# Patient Record
Sex: Male | Born: 1955 | Race: Black or African American | Hispanic: No | Marital: Single | Smoking: Never smoker
Health system: Southern US, Community
[De-identification: ages and names within clinical notes are randomized; demographics above are authoritative.]

## PROBLEM LIST (undated history)

## (undated) DIAGNOSIS — I10 Essential (primary) hypertension: Secondary | ICD-10-CM

## (undated) DIAGNOSIS — K4090 Unilateral inguinal hernia, without obstruction or gangrene, not specified as recurrent: Secondary | ICD-10-CM

## (undated) DIAGNOSIS — I2510 Atherosclerotic heart disease of native coronary artery without angina pectoris: Secondary | ICD-10-CM

## (undated) DIAGNOSIS — E7800 Pure hypercholesterolemia, unspecified: Secondary | ICD-10-CM

## (undated) DIAGNOSIS — E876 Hypokalemia: Secondary | ICD-10-CM

## (undated) HISTORY — PX: CORONARY ARTERY BYPASS GRAFT: SHX141

## (undated) HISTORY — DX: Atherosclerotic heart disease of native coronary artery without angina pectoris: I25.10

## (undated) HISTORY — DX: Unilateral inguinal hernia, without obstruction or gangrene, not specified as recurrent: K40.90

## (undated) HISTORY — DX: Essential (primary) hypertension: I10

## (undated) HISTORY — PX: TRACHEOSTOMY: SUR1362

---

## 2006-05-19 ENCOUNTER — Emergency Department: Payer: Self-pay | Admitting: Emergency Medicine

## 2006-05-19 ENCOUNTER — Other Ambulatory Visit: Payer: Self-pay

## 2006-05-23 ENCOUNTER — Other Ambulatory Visit: Payer: Self-pay

## 2006-05-23 ENCOUNTER — Emergency Department: Payer: Self-pay | Admitting: Emergency Medicine

## 2012-03-29 ENCOUNTER — Emergency Department: Payer: Self-pay | Admitting: Emergency Medicine

## 2012-03-29 LAB — URINALYSIS, COMPLETE
Bilirubin,UR: NEGATIVE
Blood: NEGATIVE
Leukocyte Esterase: NEGATIVE
Nitrite: NEGATIVE
Protein: NEGATIVE
Specific Gravity: 1.01 (ref 1.003–1.030)

## 2012-03-29 LAB — COMPREHENSIVE METABOLIC PANEL
Albumin: 3.7 g/dL (ref 3.4–5.0)
Alkaline Phosphatase: 106 U/L (ref 50–136)
BUN: 9 mg/dL (ref 7–18)
Co2: 30 mmol/L (ref 21–32)
Creatinine: 0.94 mg/dL (ref 0.60–1.30)
EGFR (African American): >60
Osmolality: 277 (ref 275–301)
SGOT(AST): 55 U/L — ABNORMAL HIGH (ref 15–37)
SGPT (ALT): 87 U/L — ABNORMAL HIGH (ref 12–78)
Sodium: 139 mmol/L (ref 136–145)
Total Protein: 7.7 g/dL (ref 6.4–8.2)

## 2012-03-29 LAB — CBC
MCH: 33.9 pg (ref 26.0–34.0)
MCHC: 35.3 g/dL (ref 32.0–36.0)
MCV: 96 fL (ref 80–100)
Platelet: 210 10*3/uL (ref 150–440)
RBC: 4.82 10*6/uL (ref 4.40–5.90)
RDW: 14 % (ref 11.5–14.5)

## 2012-03-29 LAB — CK TOTAL AND CKMB (NOT AT ARMC): CK, Total: 173 U/L (ref 35–232)

## 2012-03-29 LAB — TROPONIN I: Troponin-I: <0.02 ng/mL

## 2012-03-30 ENCOUNTER — Emergency Department: Payer: Self-pay | Admitting: Emergency Medicine

## 2012-04-27 ENCOUNTER — Emergency Department: Payer: Self-pay | Admitting: Emergency Medicine

## 2013-06-30 ENCOUNTER — Encounter: Payer: Self-pay | Admitting: Internal Medicine

## 2013-07-29 ENCOUNTER — Encounter: Payer: Self-pay | Admitting: Internal Medicine

## 2013-08-21 ENCOUNTER — Emergency Department: Payer: Self-pay | Admitting: Emergency Medicine

## 2013-08-28 ENCOUNTER — Encounter: Payer: Self-pay | Admitting: Internal Medicine

## 2014-06-11 ENCOUNTER — Emergency Department: Payer: Self-pay | Admitting: Emergency Medicine

## 2014-07-07 ENCOUNTER — Ambulatory Visit: Payer: Self-pay

## 2014-07-13 ENCOUNTER — Observation Stay: Payer: Self-pay | Admitting: Surgery

## 2014-07-14 DIAGNOSIS — K4090 Unilateral inguinal hernia, without obstruction or gangrene, not specified as recurrent: Secondary | ICD-10-CM | POA: Insufficient documentation

## 2014-07-31 DIAGNOSIS — E119 Type 2 diabetes mellitus without complications: Secondary | ICD-10-CM | POA: Insufficient documentation

## 2014-07-31 DIAGNOSIS — I2510 Atherosclerotic heart disease of native coronary artery without angina pectoris: Secondary | ICD-10-CM | POA: Insufficient documentation

## 2014-07-31 DIAGNOSIS — I10 Essential (primary) hypertension: Secondary | ICD-10-CM | POA: Insufficient documentation

## 2014-08-23 LAB — SURGICAL PATHOLOGY

## 2014-08-29 NOTE — Op Note (Signed)
PATIENT NAME:  Scott Yoder, Scott Yoder MR#:  [id number] DATE OF BIRTH:  [birth year]  DATE OF PROCEDURE:  07/13/2014  PREOPERATIVE DIAGNOSIS: Incarcerated right inguinal hernia with small bowel obstruction.   POSTOPERATIVE DIAGNOSES:  1. Incarcerated right indirect inguinal hernia with bowel obstruction.  2. Noncommunicating right hydrocele.   PROCEDURE PERFORMED: Repair of right inguinal hernia with Bassini technique, high ligation of sac, repair of noncommunicating hydrocele, and orchiopexy.   SURGEON: Raynald KempMark A Dericka Ostenson, MD   INTRAOPERATIVE CONSULTATION:  Dr. Evelene CroonWolff of urology.   SPECIMENS: Hernia sac.   ESTIMATED BLOOD LOSS: 50 mL.   DRAINS: None.   COUNT:   LAP and needle count correct x 2.   DESCRIPTION OF PROCEDURE: With informed consent, supine position and general oral endotracheal anesthesia, the patient's abdomen was widely clipped of hair, including the right groin. The abdomen then was prepped with ChloraPrep, the scrotum and penis with Betadine. Collier Flowersoban was applied. Timeout was observed.   A transverse oriented skin incision was fashioned above the right inguinal ligament with a scalpel and carried through subcutaneous tissues and Scarpa's fascia with electrocautery and small bridging veins were divided in continuity between ties of #2-0 Vicryl suture. The external oblique aponeurosis was identified. It was incised laterally and carried with sharp dissection through the external ring. The ilioinguinal nerve was splayed out over the surface and was later divided between clamps and ties of #2-0 Vicryl, and submitted.   The hernia sac was encircled with blunt technique at the pubic tubercle. Gentle  pressure from the scrotum from below demonstrated this able to be easily reduced. Dissection of the cord structures demonstrated a testicle which was not attached to the gubernaculum. 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 Due to the nature of the intraoperative findings, I asked Dr. Evelene CroonWolff  for his assistance. He confirmed that this was indeed the case. A  noncommunicating hydrocele was identified. This was opened. It was then marsupialized  posteriorly on the testicle with a running locking #3-0 chromic suture. Hemostasis was adequate. Dissection of the cord demonstrated a thick, chronic appearing indirect inguinal hernia sac which was tediously dissected off the vas and vessels. It was ligated at its base utilizing a locking #0 silk suture x 2.  It was returned into the peritoneal cavity. Orchiopexy was then performed with an 0 silk suture through the gubernaculum to the posterior aspect of the testicle. Testicle was then returned to the scrotum. [facility] then [facility].  A relaxing incision was then performed and several interrupted #0 Ethibond sutures were placed starting at the pubic tubercle and ending just medial to the internal ring  reinforcing the floor and closing the ring. There was  minimal tension. The wound was then copiously irrigated with antibiotic solution.  External oblique aponeurosis was then reapproximated with #0 Vicryl suture in running fashion. Scarpa's fascia reapproximated with 3-0 Vicryl suture. A 3-0 Vicryl suture was placed in the deep dermal layer, followed by 4-0 running subcuticular in the skin.  Benzoin, Steri-Strips, and occlusive sterile dressing was then placed and the patient was then taken to the recovery room in stable and satisfactory condition being extubated by anesthesia.     ____________________________ Redge GainerMark A. Egbert GaribaldiBird, MD mab:tr D: 07/14/2014 16:26:02 ET T: 07/14/2014 18:01:33 ET JOB#: [id number]  cc: Loraine LericheMark A. Egbert GaribaldiBird, MD, <Dictator> Raynald KempMARK A Jaeanna Mccomber MD ELECTRONICALLY SIGNED 07/21/2014 13:41

## 2014-08-29 NOTE — Consult Note (Signed)
PATIENT NAME:  Scott Yoder, Scott Yoder MR#:  [id number] DATE OF BIRTH:  [birth year]  DATE OF CONSULTATION:  07/13/2014  REQUESTING PHYSICIAN:  Natale LayMark Bird, MD  CONSULTING PHYSICIAN:  Suszanne ConnersMichael R. Evelene CroonWolff, MD   REASON FOR CONSULTATION: Intraoperative consultation for hydroceles.   HISTORY OF PRESENT ILLNESS: [name] was undergoing right inguinal herniorrhaphy by Dr. Egbert GaribaldiBird and he noticed swelling of the scrotum. Urologic consultation was requested.   Intraoperative exam revealed a non-communicating right hydrocele. The hydrocele sac was opened and did not appear to communicate with the hernia sac. The hydrocele sac was then marsupialized posteriorly. At this point, urological consultation was suspended.  Remaining portion of the operation will be dictated by Dr. Egbert GaribaldiBird.      ____________________________ Suszanne ConnersMichael R. Evelene CroonWolff, MD mrw:DT D: 07/13/2014 15:22:19 ET T: 07/13/2014 16:51:32 ET JOB#: [id number]  cc: Suszanne ConnersMichael R. Evelene CroonWolff, MD, <Dictator> Orson ApeMICHAEL R WOLFF MD ELECTRONICALLY SIGNED 07/14/2014 13:40

## 2014-08-29 NOTE — H&P (Signed)
Subjective/Chief Complaint right groin pain and abdominal pain   History of Present Illness 59 y/o male with long standing right inguinal hernia,  Recently he began having more significant symptoms of pain and swelling of said hernia.  Two days ago began having much more swelling on right scrotum.  Last flatus 2 days ago, no nausea or vomiting, last Bowel movement yesterday.  Came to ER today with increasing pain,  Workup in ER shows large right inguinal hernia containing small loops of bowel with clear obstruction.  He has been seen in urology office as was evaluated with CT scan 2 weeks ago showing large right inguinal hernia.  No prior repairs.   Past History CAD CABg Hypertension   Past Medical Health Coronary Artery Disease, Hypertension   Code Status Full Code   Past Med/Surgical Hx:  Tobacco Use:   Diabetes:   Hypertension:   CABG:   ALLERGIES:  No Known Allergies:    Other Allergies none.   HOME MEDICATIONS: Medication Instructions Status  cyclobenzaprine 10 mg oral tablet 1  orally 3 times a day, As Needed for muscle spasms Active  Tylenol 500 mg oral tablet 1 tab(s) orally every 4 hours, As Needed - for Pain Active  omeprazole 20 mg oral delayed release capsule 1 cap(s) orally once a day for stomach pain Active  simvastatin 20 mg oral tablet 1 tab(s) orally once a day (at bedtime) Active  fluticasone nasal 50 mcg/inh nasal spray 2 spray(s) nasal once a day Active  Percocet 5/325 oral tablet 1 cap(s) orally every 4 to 6 hours, As Needed - for Pain Active  aspirin 81 mg oral tablet, chewable 1 tab(s) orally once a day Active  carvedilol 25 mg oral tablet 2 tab(s) orally 2 times a day Active  DULoxetine 60 mg oral delayed release capsule 1 cap(s) orally once a day (in the evening) Active  hydrOXYzine hydrochloride 25 mg oral tablet 1 tab(s) orally 3 times a day, As Needed - for Itching Active  lisinopril 20 mg oral tablet 1 tab(s) orally 2 times a day Active   hydrochlorothiazide 25 mg oral tablet 1 tab(s) orally once a day (in the morning) Active  Nitrostat 0.4 mg sublingual tablet 1 tab(s) sublingual every 5 minutes, As Needed - for Chest Pain (may take up to 3 doses) Active   Family and Social History:  Family History Hypertension   Social History positive  tobacco, negative ETOH, negative Illicit drugs   + Tobacco Current (within 1 year)   Place of Living Home   Review of Systems:  Subjective/Chief Complaint see above.  remaining 10 point review obtained and otherwise negative.   Abdominal Pain Yes   Diarrhea No   Constipation Yes   Nausea/Vomiting No   SOB/DOE No   Chest Pain No   Dysuria No   Medications/Allergies Reviewed Medications/Allergies reviewed   Physical Exam:  GEN no acute distress, thin, disheveled, temp 97.6 p 86 bp 125/89   HEENT pale conjunctivae, PERRL   NECK supple  No masses   RESP normal resp effort  clear BS   CARD regular rate   ABD denies tenderness  no liver/spleen enlargement  positive hernia  soft  large non-reducible incarcerated inguinal hernia   LYMPH negative neck   EXTR negative cyanosis/clubbing   SKIN normal to palpation, No rashes, No ulcers   NEURO cranial nerves intact   PSYCH A+O to time, place, person, good insight   Lab Results:  Hepatic:  15-Mar-16 04:54  Bilirubin, Total 0.9 (0.3-1.2 NOTE: New Reference Range  06/22/14)  Alkaline Phosphatase 75 (38-126 NOTE: New Reference Range  06/22/14)  SGPT (ALT)  91 (17-63 NOTE: New Reference Range  06/22/14)  SGOT (AST)  88 (15-41 NOTE: New Reference Range  06/22/14)  Total Protein, Serum  8.8 (6.5-8.1 NOTE: New Reference Range  06/22/14)  Albumin, Serum 4.8 (3.5-5.0 NOTE: New reference range  06/22/14)  Routine Chem:  15-Mar-16 04:54   Lipase  68 (22-51 NOTE: New Reference Range  06/22/14)  Glucose, Serum  141 (65-99 NOTE: New Reference Range  06/22/14)  BUN  23 (6-20 NOTE: New Reference Range   06/22/14)  Creatinine (comp)  1.56 (0.61-1.24 NOTE: New Reference Range  06/22/14)  Sodium, Serum  134 (135-145 NOTE: New Reference Range  06/22/14)  Potassium, Serum 3.7 (3.5-5.1 NOTE: New Reference Range  06/22/14)  Chloride, Serum  96 (101-111 NOTE: New Reference Range  06/22/14)  CO2, Serum 27 (22-32 NOTE: New Reference Range  06/22/14)  Calcium (Total), Serum 9.9 (8.9-10.3 NOTE: New Reference Range  06/22/14)  eGFR (African American)  57  eGFR (Non-African American)  49 (eGFR values <50m/min/1.73 m2 may be an indication of chronic kidney disease (CKD). Calculated eGFR is useful in patients with stable renal function. The eGFR calculation will not be reliable in acutely ill patients when serum creatinine is changing rapidly. It is not useful in patients on dialysis. The eGFR calculation may not be applicable to patients at the low and high extremes of body sizes, pregnant women, and vegetarians.)  Anion Gap 11    07:28   Lactic Acid  Venous 1.0 (0.5-2.0 NOTE: New Reference Range  06/22/14)  Routine Hem:  15-Mar-16 04:54   WBC (CBC) 6.6  RBC (CBC) 5.30  Hemoglobin (CBC) 17.0  Hematocrit (CBC) 50.3  Platelet Count (CBC) 298  MCV 95  MCH 32.1  MCHC 33.8  RDW 13.2  Neutrophil % 62.8  Lymphocyte % 27.0  Monocyte % 8.2  Eosinophil % 1.3  Basophil % 0.7  Neutrophil # 4.1  Lymphocyte # 1.8  Monocyte # 0.5  Eosinophil # 0.1  Basophil # 0.0 (Result(s) reported on 13 Jul 2014 at 05:55AM.)   Radiology Results: LabUnknown:    15-Mar-16 08:05, CT Abdomen and Pelvis With Contrast  PACS Image  CT:  CT Abdomen and Pelvis With Contrast  REASON FOR EXAM:    (1) abd pain with large hernia eval incarceration;   (2) abd pain with large herni  COMMENTS:       PROCEDURE: CT  - CT ABDOMEN / PELVIS  W  - Jul 13 2014  8:05AM     CLINICAL DATA:  Progressive abdominal pain, most severe in the  periumbilical region. Nausea and vomiting    EXAM:  CT ABDOMEN AND PELVIS  WITH CONTRAST    TECHNIQUE:  Multidetector CT imaging of the abdomen and pelvis was performed  using the standard protocol following bolus administration of  intravenous contrast. Oral contrast also administered.    CONTRAST:  80 mL Omnipaque 300 nonionic    COMPARISON:  July 07, 2014    FINDINGS:  There is patchy atelectasis in the left base region. There are  multiple foci of coronary artery calcification.    There is hepatic steatosis. There is a 1 cm apparent cyst in the  anterior segment right lobe of the liver. No other focal liver  lesions are identified. Gallbladder wall is not thickened. There is  no appreciable biliary duct dilatation.  Spleen,  pancreas, and adrenals appear normal. Kidneys bilaterally  show no mass or hydronephrosis on either side. There is no renal or  ureteral calculus on either side.    In the pelvis, urinary bladder is midline with normal wall  thickness. There is no pelvic mass or pelvic fluid collection. There  are several small calcifications in the prostate. There is no  periappendiceal region inflammation.    There is a right inguinal hernia which contains bowel. There is  bowel obstruction with transition zone occurring within the right  inguinal hernia suggesting a degree of incarceration within this  hernia. There is no free air or portal venous air.    There is no appreciable ascites or abscess. No adenopathy is seen in  the abdomen or pelvis. There is atherosclerotic change in the aorta  and iliac arteries without aneurysm. There are no blastic or lytic  bone lesions. There is vacuum phenomenon at L5-S1. There is a rather  minimal ventral hernia containing only fat.     IMPRESSION:  Findings consistent with incarcerated right inguinal hernia with  bowel obstruction. Transition zone occurs within this hernia in the  right inguinal region. No free air.    No abscess.    Hepatic steatosis.    Atelectatic change left lung base.  Multiple foci of coronary artery  calcification present.    Critical Value/emergent results were called by telephone at the time  of interpretation on 07/13/2014 at 8:17 am to Dr. Archie Balboa, ED  physician , who verbally acknowledged these results.      Electronically Signed    By: Daneen Schick M.D.    On: 07/13/2014 08:17         Verified By: Leafy Kindle. WOODRUFF, M.D.,    Assessment/Admission Diagnosis 59 year old male with large incarcerated right inguinal hernia with small bowel obstruction. Needs urgent repair.   Plan Admit, plan open right inguinal hernia with or without mesh based on operative findings.  I discussed with him need for repair due to bowel obstruction.  Reviewed with him risks of bleeding, infection, possible bowel resection, mesh infection and chronic pain.  All questions addressed  All PACS images personally reviewed.   Electronic Signatures: Sherri Rad (MD)  (Signed 15-Mar-16 12:12)  Authored: CHIEF COMPLAINT and HISTORY, PAST MEDICAL/SURGIAL HISTORY, ALLERGIES, Other Allergies, HOME MEDICATIONS, FAMILY AND SOCIAL HISTORY, REVIEW OF SYSTEMS, PHYSICAL EXAM, LABS, Radiology, ASSESSMENT AND PLAN   Last Updated: 15-Mar-16 12:12 by Sherri Rad (MD)

## 2016-03-15 ENCOUNTER — Emergency Department
Admission: EM | Admit: 2016-03-15 | Discharge: 2016-03-15 | Disposition: A | Discharge status: Home / Self Care | Payer: Self-pay | Attending: Emergency Medicine | Admitting: Emergency Medicine

## 2016-03-15 ENCOUNTER — Encounter: Payer: Self-pay | Admitting: Emergency Medicine

## 2016-03-15 DIAGNOSIS — I2510 Atherosclerotic heart disease of native coronary artery without angina pectoris: Secondary | ICD-10-CM | POA: Insufficient documentation

## 2016-03-15 DIAGNOSIS — I10 Essential (primary) hypertension: Secondary | ICD-10-CM | POA: Insufficient documentation

## 2016-03-15 DIAGNOSIS — Z7982 Long term (current) use of aspirin: Secondary | ICD-10-CM | POA: Insufficient documentation

## 2016-03-15 DIAGNOSIS — M75102 Unspecified rotator cuff tear or rupture of left shoulder, not specified as traumatic: Secondary | ICD-10-CM | POA: Insufficient documentation

## 2016-03-15 DIAGNOSIS — F17200 Nicotine dependence, unspecified, uncomplicated: Secondary | ICD-10-CM | POA: Insufficient documentation

## 2016-03-15 DIAGNOSIS — E119 Type 2 diabetes mellitus without complications: Secondary | ICD-10-CM | POA: Insufficient documentation

## 2016-03-15 DIAGNOSIS — Z951 Presence of aortocoronary bypass graft: Secondary | ICD-10-CM | POA: Insufficient documentation

## 2016-03-15 HISTORY — DX: Pure hypercholesterolemia, unspecified: E78.00

## 2016-03-15 HISTORY — DX: Hypokalemia: E87.6

## 2016-03-15 MED ORDER — PREDNISONE 10 MG PO TABS: 10.0000 mg | ORAL_TABLET | Freq: Every day | ORAL | 0 refills | Status: DC

## 2016-03-15 NOTE — ED Triage Notes (Signed)
Pt states on Monday was working on a car and lifted the hood of the car up. Pt states next day had pain and limited movement to L shoulder. Pt states he is unable to raise his shoulder as high as his R arm and c/o pain with movement. Pt states has not had his BP medication today.

## 2016-03-15 NOTE — ED Notes (Signed)
See triage note  States he lifted a car hood on Monday  Woke up on Tuesday with limited movement to left arm/shoulder

## 2016-03-15 NOTE — Discharge Instructions (Signed)
Please ice left shoulder 20 minutes every hour for the next few days. Wear a sling for no more than 3 days as needed for comfort. Take prednisone as prescribed. Follow-up with orthopedic office 5-7 days if no improvement. Return to the ER for any worsening symptoms urgent changes in her health.

## 2016-03-15 NOTE — ED Provider Notes (Signed)
[facility]-EMERGENCY DEPARTMENT Provider Note   CSN: [id number] Arrival date & time: 03/15/16  1754     History   Chief Complaint Chief Complaint  Patient presents with  . Shoulder Pain    HPI Scott Luther RedoHarrelson Jr. is a 60 y.o. male presents to the emergency department for evaluation of left shoulder pain. Patient states 3 days ago he was lifting a car hood, denies any trauma or injury, the next morning developed severe left shoulder pain he describes as weakness and pain that is 9 out of 10 with attempted abduction and flexion greater than 90. Pain is over the lateral deltoid. He describes the pain as a constant ache. Pain is improved with keeping the arm by his side. He denies any neck pain numbness tingling or radicular symptoms. No chest pain, shortness of breath. He is unable to take NSAIDs, has not taken any Tylenol. Patient has a history of hypertension, is on blood pressure medications which she has at home, has not taken his medications today. Denies any headaches, vision changes, chest pain.  HPI  Past Medical History:  Diagnosis Date  . Coronary artery disease   . Hernia, inguinal   . High cholesterol   . Hypertension   . Hypokalemia     Patient Active Problem List   Diagnosis Date Noted  . Atherosclerosis of coronary artery 07/31/2014  . Diabetes (HCC) 07/31/2014  . BP (high blood pressure) 07/31/2014  . Coronary artery disease   . Hernia, inguinal 07/14/2014    Past Surgical History:  Procedure Laterality Date  . CORONARY ARTERY BYPASS GRAFT    . TRACHEOSTOMY         Home Medications    Prior to Admission medications   Medication Sig Start Date End Date Taking? Authorizing Provider  aspirin 81 MG tablet Take 81 mg by mouth daily.    Historical Provider, MD  carvedilol (COREG) 25 MG tablet Take 2 tablets by mouth 2 (two) times daily.    Historical Provider, MD  cyclobenzaprine (FLEXERIL) 10 MG tablet Take 10 mg by mouth 3 (three) times daily as needed.     Historical Provider, MD  DULoxetine (CYMBALTA) 60 MG capsule Take 60 mg by mouth daily.    Historical Provider, MD  fluticasone (FLONASE) 50 MCG/ACT nasal spray Place 2 sprays into the nose daily.    Historical Provider, MD  hydrochlorothiazide (HYDRODIURIL) 25 MG tablet Take 25 mg by mouth daily.    Historical Provider, MD  HYDROcodone-acetaminophen (NORCO/VICODIN) 5-325 MG per tablet Take 1 tablet by mouth every 4 (four) hours as needed.    Historical Provider, MD  hydrOXYzine (ATARAX/VISTARIL) 25 MG tablet Take 20 mg by mouth 3 (three) times daily as needed.    Historical Provider, MD  lisinopril (PRINIVIL,ZESTRIL) 20 MG tablet Take 20 mg by mouth 2 (two) times daily.    Historical Provider, MD  nitroGLYCERIN (NITROSTAT) 0.4 MG SL tablet Place 0.4 mg under the tongue 3 (three) times daily as needed. 1 tab every five minutes x 3, as needed x chest pain    Historical Provider, MD  Omeprazole 20 MG TBEC Take 20 mg by mouth daily.    Historical Provider, MD  predniSONE (DELTASONE) 10 MG tablet Take 1 tablet (10 mg total) by mouth daily. 6,5,4,3,2,1 six day taper 03/15/16   Evon Slackhomas C Samreet Edenfield, PA-C  simvastatin (ZOCOR) 20 MG tablet Take 20 mg by mouth at bedtime.    Historical Provider, MD    Family History Family History  Problem Relation  Age of Onset  . Diabetes Mother   . Hypertension Mother   . Stroke Mother     Social History Social History  Substance Use Topics  . Smoking status: Current Some Day Smoker  . Smokeless tobacco: Never Used  . Alcohol use No     Allergies   Patient has no known allergies.   Review of Systems Review of Systems  Constitutional: Negative.  Negative for activity change, appetite change, chills and fever.  HENT: Negative for congestion, ear pain, mouth sores, rhinorrhea, sinus pressure, sore throat and trouble swallowing.   Eyes: Negative for photophobia, pain and discharge.  Respiratory: Negative for cough, chest tightness and shortness of breath.     Cardiovascular: Negative for chest pain and leg swelling.  Gastrointestinal: Negative for abdominal distention, abdominal pain, diarrhea, nausea and vomiting.  Genitourinary: Negative for difficulty urinating and dysuria.  Musculoskeletal: Positive for arthralgias. Negative for back pain, gait problem and joint swelling.  Skin: Negative for color change and rash.  Neurological: Negative for dizziness and headaches.  Hematological: Negative for adenopathy.  Psychiatric/Behavioral: Negative for agitation and behavioral problems.     Physical Exam Updated Vital Signs BP (!) 161/103 (BP Location: Right Arm)   Pulse 76   Temp 98 F (36.7 C) (Oral)   Resp 18   Ht 5\' 10"  (1.778 m)   Wt 79.4 kg   SpO2 98%   BMI 25.11 kg/m   Physical Exam  Constitutional: He appears well-developed and well-nourished.  HENT:  Head: Normocephalic and atraumatic.  Right Ear: External ear normal.  Left Ear: External ear normal.  Nose: Nose normal.  Eyes: Conjunctivae and EOM are normal. Pupils are equal, round, and reactive to light.  Neck: Normal range of motion. Neck supple.  Cardiovascular: Normal rate and regular rhythm.   No murmur heard. Pulmonary/Chest: Effort normal and breath sounds normal. No respiratory distress. He has no wheezes. He has no rales. He exhibits no tenderness.  Abdominal: Soft. There is no tenderness. There is no guarding.  Musculoskeletal:  Examination of the cervical spine shows patient has full range of motion with no discomfort. Nontender along the spinous process. Patient has no asymmetry noted along the shoulders. He is tender along the lateral deltoid and subacromial space. Has bicipital groove tenderness left. He has active abduction and flexion to 90 with inability to lift the arm above 90. Passively he can be lifted 130 abduction and is able to maintain range of motion. He has severe pain with abduction and flexion greater than 90. He has good internal and external  rotation with no discomfort. There is no warmth or redness throughout the shoulder. He has full composite fist and grip strength 5 out of 5.  Neurological: He is alert.  Skin: Skin is warm and dry.  Psychiatric: He has a normal mood and affect.  Nursing note and vitals reviewed.    ED Treatments / Results  Labs (all labs ordered are listed, but only abnormal results are displayed) Labs Reviewed - No data to display  EKG  EKG Interpretation None       Radiology No results found.  Procedures Procedures (including critical care time) SPLINT APPLICATION Date/Time: 7:27 PM Authorized by: Patience MuscaGAINES, Marshall Kampf CHRISTOPHER Consent: Verbal consent obtained. Risks and benefits: risks, benefits and alternatives were discussed Consent given by: patient Splint applied by: ED tech Location details: Left sling  Splint type: Sling  Supplies used: Sling  Post-procedure: The splinted body part was neurovascularly unchanged following the procedure.  Patient tolerance: Patient tolerated the procedure well with no immediate complications.     Medications Ordered in ED Medications - No data to display   Initial Impression / Assessment and Plan / ED Course  I have reviewed the triage vital signs and the nursing notes.  Pertinent labs & imaging results that were available during my care of the patient were reviewed by me and considered in my medical decision making (see chart for details).  Clinical Course     60 year old male with left shoulder pain. Pain consistent with rotator cuff syndrome, possible rotator cuff tear. He is placed into a sling for 2-3 days only. He will ice the shoulder 20 minutes every hour for the next few days. Prednisone 6 day taper, unable tolerate NSAIDs. He is advised to restart his home blood pressure medications, states he has his medications at home. Currently denies any headache, vision changes, chest pain. He will follow-up with orthopedics in 5-7 days if no  improvement of left shoulder pain.  Final Clinical Impressions(s) / ED Diagnoses   Final diagnoses:  Rotator cuff syndrome, left    New Prescriptions New Prescriptions   PREDNISONE (DELTASONE) 10 MG TABLET    Take 1 tablet (10 mg total) by mouth daily. 6,5,4,3,2,1 six day taper     Evon Slack, PA-C 03/15/16 1928    Charlynne Pander, MD 03/15/16 [id number]

## 2016-05-08 ENCOUNTER — Other Ambulatory Visit: Payer: Self-pay | Admitting: Orthopedic Surgery

## 2016-05-08 DIAGNOSIS — M25512 Pain in left shoulder: Secondary | ICD-10-CM

## 2016-05-14 ENCOUNTER — Ambulatory Visit
Admission: RE | Admit: 2016-05-14 | Discharge: 2016-05-14 | Disposition: A | Discharge status: Home / Self Care | Payer: Medicare Other | Source: Ambulatory Visit | Attending: Orthopedic Surgery | Admitting: Orthopedic Surgery

## 2016-05-14 DIAGNOSIS — M25512 Pain in left shoulder: Secondary | ICD-10-CM | POA: Insufficient documentation

## 2016-05-14 DIAGNOSIS — M75102 Unspecified rotator cuff tear or rupture of left shoulder, not specified as traumatic: Secondary | ICD-10-CM | POA: Insufficient documentation

## 2016-05-23 IMAGING — CT CT ABD-PELV W/O CM
2 of 4 series · 15 of 46 positions shown, 17 images · non-contrast
Comparison: None.

CLINICAL DATA: Initial encounter for right groin swelling
intermittently with pain for approximately 1 year.

EXAM:
CT ABDOMEN AND PELVIS WITHOUT CONTRAST
TECHNIQUE: Multidetector CT imaging of the abdomen and pelvis was performed
following the standard protocol without IV contrast.

[Series 5: routine without. · axial · non-contrast · 0.72mm/px · z∈[-970,-495]mm · 12 of 105 slices shown, 14 images]
[im 5/105  soft-tissue]
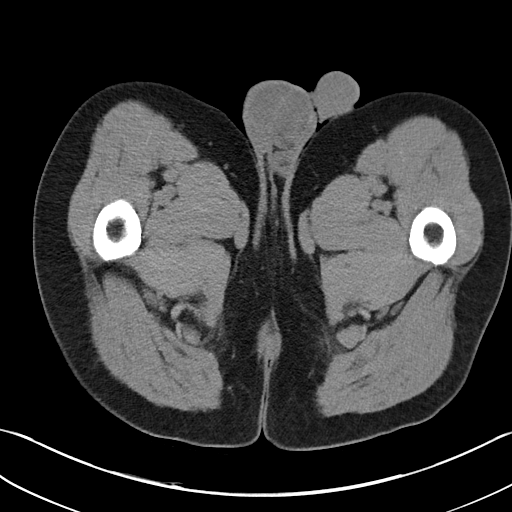
[im 5/105  bone]
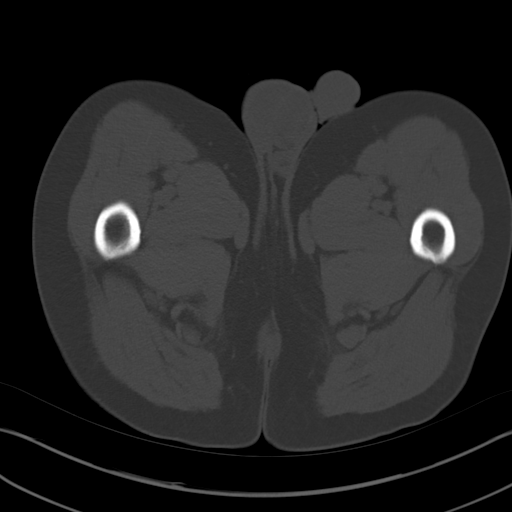
[im 15/105  soft-tissue]
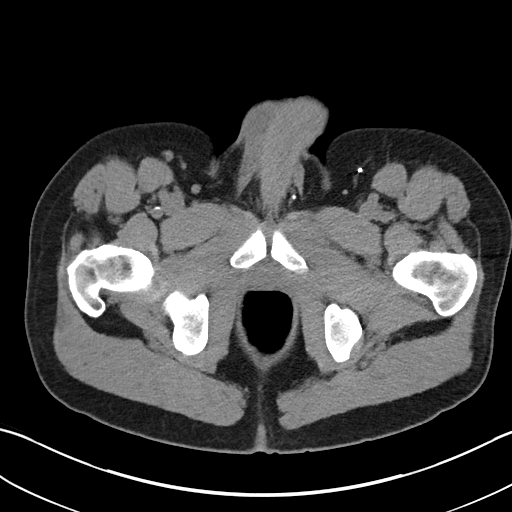
[im 24/105  soft-tissue]
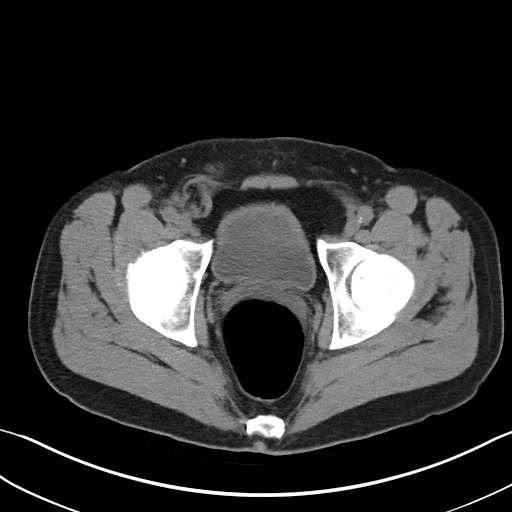
[im 34/105  soft-tissue]
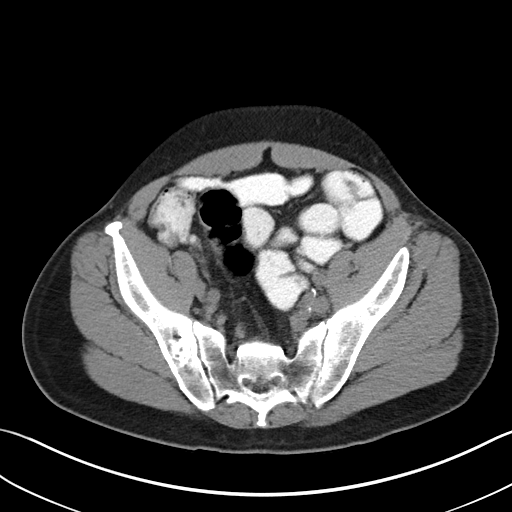
[im 38/105  soft-tissue]
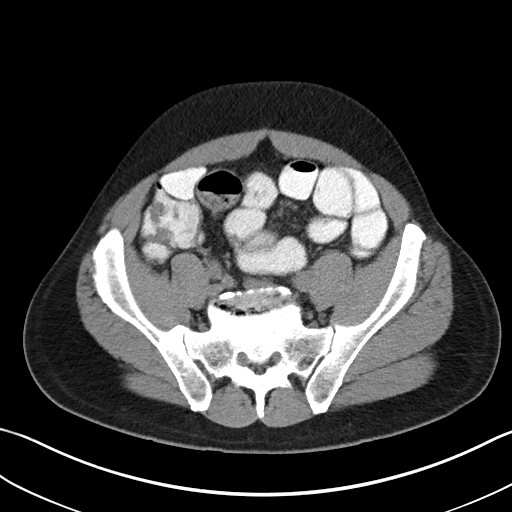
[im 48/105  soft-tissue]
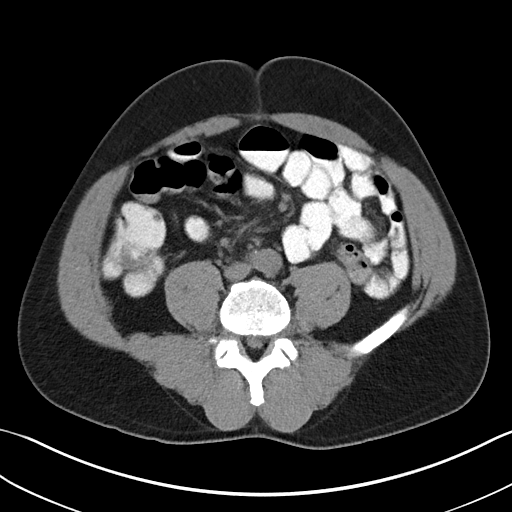
[im 57/105  soft-tissue]
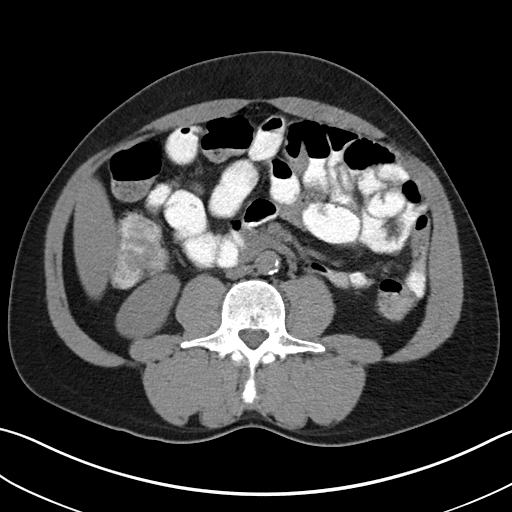
[im 67/105  soft-tissue]
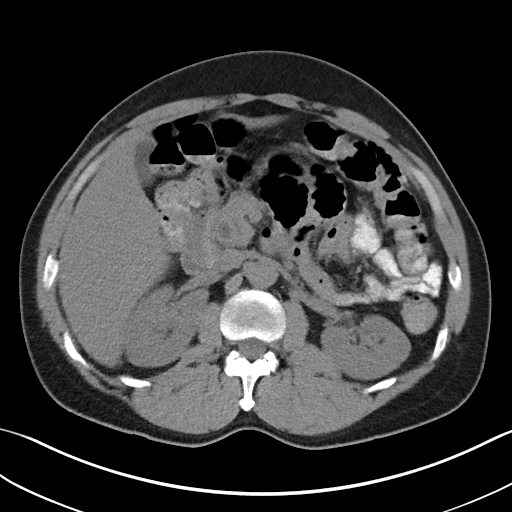
[im 71/105  soft-tissue]
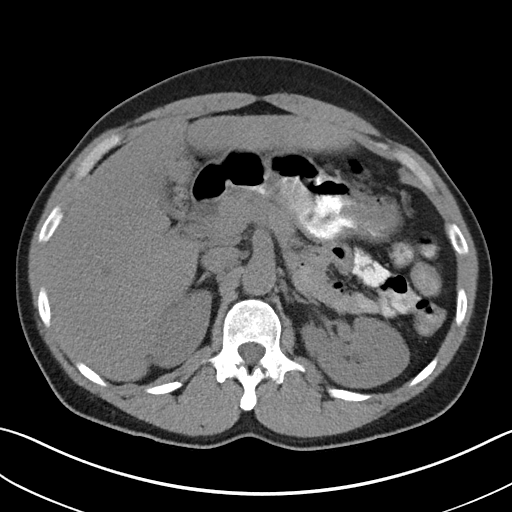
[im 71/105  bone]
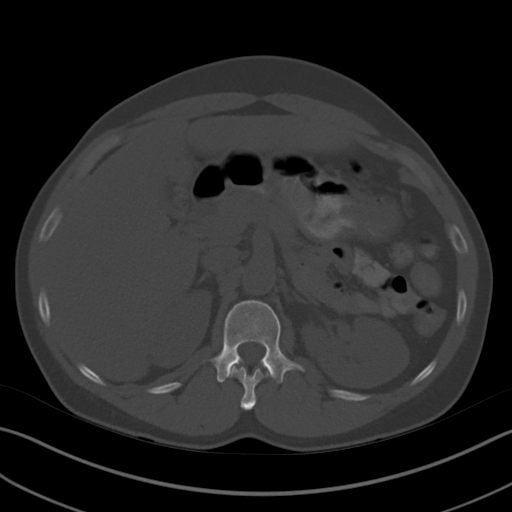
[im 81/105  soft-tissue]
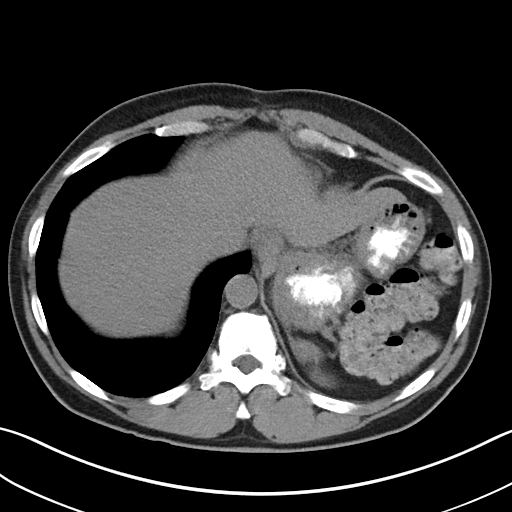
[im 90/105  soft-tissue]
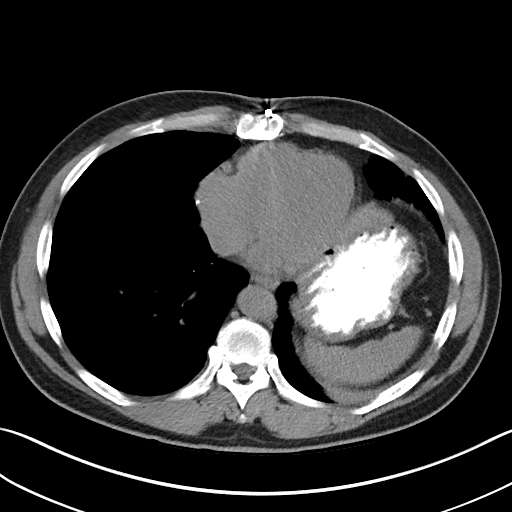
[im 100/105  soft-tissue]
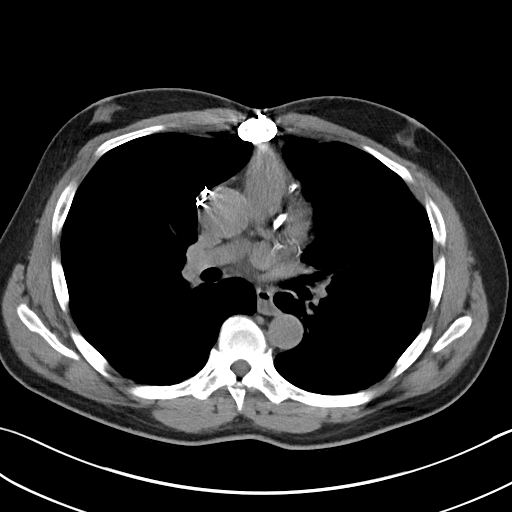

[Series 6: cor routine without · coronal · non-contrast · 0.71mm/px · 3 of 154 slices shown]
[im 52/154  soft-tissue]
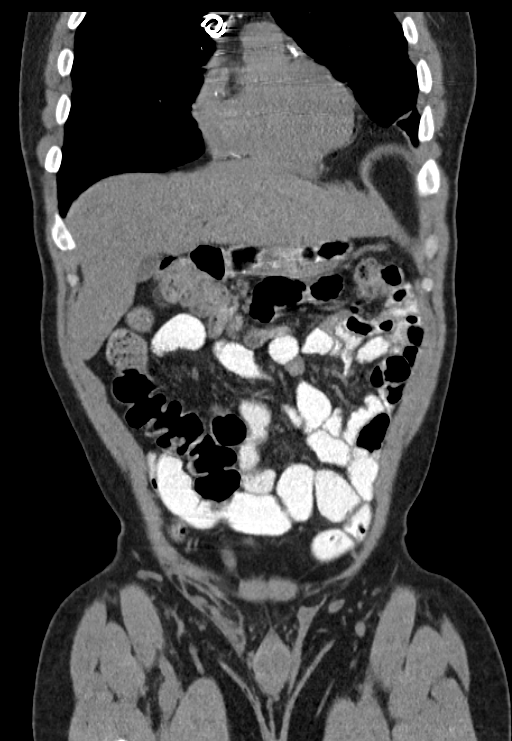
[im 69/154  soft-tissue]
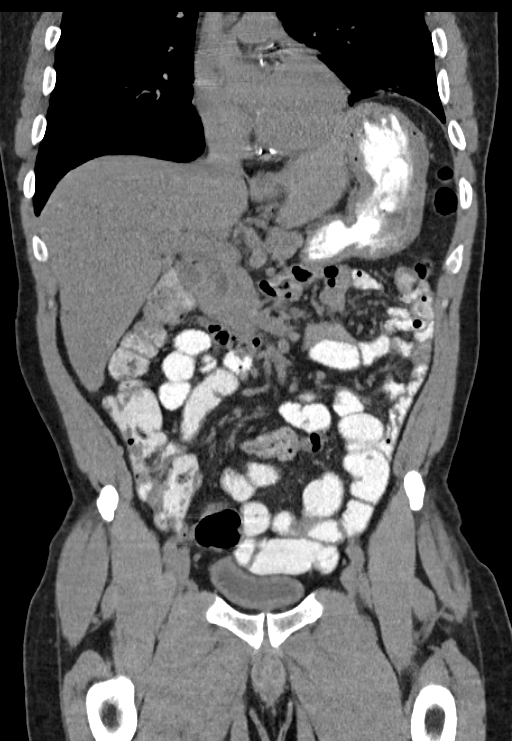
[im 86/154  soft-tissue]
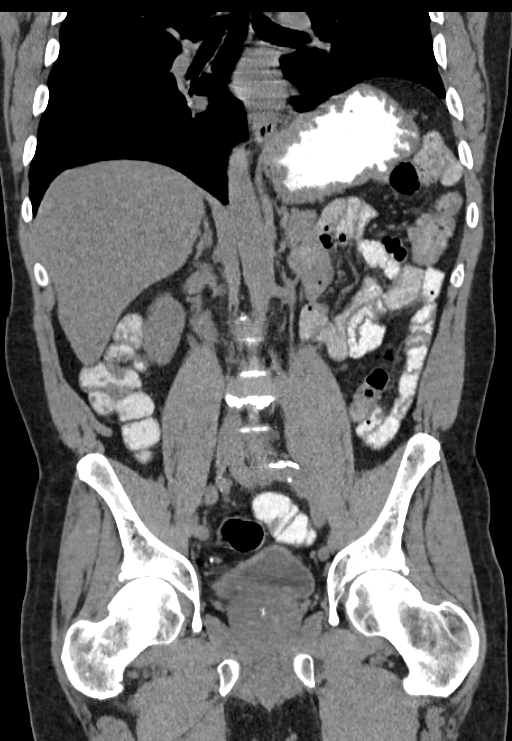

[15 of 46 positions shown; findings below may reference images not displayed]

FINDINGS: Lower chest: Emphysema with volume loss in the left hemi thorax and
left lower lobe basilar collapse with some associated
bronchiectasis.

Hepatobiliary: The 6 mm hypodensity in the central right liver is
too small to characterize but likely represents a cyst. Gallbladder
is decompressed. No intrahepatic or extrahepatic biliary dilation.

Pancreas: No focal mass lesion. No dilatation of the main duct. No
intraparenchymal cyst. No peripancreatic edema.

Spleen: No splenomegaly. No focal mass lesion.

Adrenals/Urinary Tract: No adrenal nodule or mass. Kidneys are
unremarkable. No hydroureter. Urinary bladder is normal in
appearance.

Stomach/Bowel: Stomach is nondistended. No gastric wall thickening.
No evidence of outlet obstruction. Duodenum is normally positioned
as is the ligament of Treitz. No small bowel wall thickening. No
small bowel dilatation. Terminal ileum is normal. The appendix is
not dilated. Diverticular changes are noted in the left colon
without evidence of diverticulitis.

Vascular/Lymphatic: Atherosclerotic calcification is noted in the
wall of the abdominal aorta without aneurysm. No lymphadenopathy in
the abdomen or pelvis.

Reproductive: Prostate gland is mildly enlarged.

Other: No intraperitoneal free fluid. A right inguinal hernia
contains fat and the appendix.

Musculoskeletal: Bone windows reveal no worrisome lytic or sclerotic
osseous lesions.
IMPRESSION: 1. Right inguinal hernia contains fat can be normal-appearing
appendix.

2. Subsegmental atelectasis in the left lower lobe.

## 2016-07-10 ENCOUNTER — Other Ambulatory Visit: Payer: Self-pay | Admitting: Neurology

## 2016-07-10 DIAGNOSIS — R29898 Other symptoms and signs involving the musculoskeletal system: Secondary | ICD-10-CM

## 2016-07-20 ENCOUNTER — Ambulatory Visit: Payer: Medicare Other

## 2016-09-18 ENCOUNTER — Ambulatory Visit
Admission: RE | Admit: 2016-09-18 | Discharge: 2016-09-18 | Disposition: A | Discharge status: Home / Self Care | Payer: Medicare Other | Source: Ambulatory Visit | Attending: Neurology | Admitting: Neurology

## 2016-09-18 DIAGNOSIS — M4802 Spinal stenosis, cervical region: Secondary | ICD-10-CM | POA: Insufficient documentation

## 2016-09-18 DIAGNOSIS — M4302 Spondylolysis, cervical region: Secondary | ICD-10-CM | POA: Diagnosis not present

## 2016-09-18 DIAGNOSIS — R531 Weakness: Secondary | ICD-10-CM | POA: Insufficient documentation

## 2016-09-18 DIAGNOSIS — R29898 Other symptoms and signs involving the musculoskeletal system: Secondary | ICD-10-CM | POA: Diagnosis present

## 2016-12-28 NOTE — Progress Notes (Signed)
 [facility]                    PT Daily Progress Note  Patient's Name:Scott Yoder                        MRN #[id number]      Date:12/28/2016  Visit Number: 7  Progress Related to Long and Short Term Goals:                                                                                            [] AROM:             [] PROM:     [] Strength:                                Pain -     while- [] at rest    [] During activity:  []  Functional goal:   []  Other:    []  Balance:     Patient / Family Education:    []  Progressed HEP to include:  []  Reviewed previous HEP:   ___________________________________________________________________________________   Skilled Service Provided:  To: []  right   [x]  left     []  bilateral    []   core/body stability and balance  []  neck   [x]   shoulder   []  upper arm/  []  elbow   []  lower arm  []  wrist   []  hand   []   fingers  []  back     []  hip    []  upper leg    []  knee  []  lower leg   []   ankle  foot             Ther Ex Therapeutic Procedure CPT 97110 : 55 minutes         Manual Tx       NeuroMusc          Ther Act         ADL/HEP          Gait Trng            Mech Trax    min        []  Inc ROM [] Inc joint mob []  Inc ROM [] Inc ROM []  Inc Posture [] Inc Bal [] Dec pain  []  Inc Flex []  Inc Flex []  Inc Flex [] Inc Flex [] Dec pain [] Dec pain [] Inc align  [employer] []  Inc ROM [] Inc Strength  [] Inc Strength          [] Inc Bal [] Normalized Gait  [] Inc mob   [x]  Inc Strength       [] Astym [] Inc Bal [] Inc Bal [] Prevent     Injury    Progressive     E  HEP    Exercise          []   [] tension/spasms/soft tissue Mob [] Inc pain   During: [] Lifting [] Reaching [] Bending [] Sitting [] Sit to stand [] Twisting [] Pulling [] Pushing [] Inc function    [x] Inc Posture [] Correct malaign [] Dec tone  []  Progressive  functional HEP                E-Stim           US  x min             Ice / Heat                  Vaso-pneumaticcompress               Infrared            IONTO                  [] Dec inflam [] Inc Circ  [] Dec pain [] Dec swelling Setting:     [] Dec pain [] Dec pain [] Dec inflam [] Dec edema [] Inc Healing   [] Dec swelling [] Dec swelling [] Dec swelling    Level      []  1        []  2      []  3 [] Dec pain   [] Inc circulation [] Dec inflam [] Inc circulation Temp:         F [] Inc circulation   [] Inc tissue mob  [] Inc soft tissue mob     Settings:        Subjective: no new complaints today   Treatment notes:   Assessment of Treatment: The patient completed therapeutic exercises without difficulty per flowsheet  Patient's Response to Treatment:  [] Dec pain  [] Inc pain    []  Inc flexibility    [x] Inc endurance   [x]  Inc strength     []  Inc AROM      []  Inc PROM       []  Dec Spasms    [] Inc Posture / alignment     []  Inc Joint Mobility   []  Other:   Functional Improvement Noted:  Increased ability to  []  walk   []  stand for longer periods   []  dress with less help    []  lift    []   reach   []   bend  []  Other:  Remaining Impairment Requiring Continued Treatment:   []  Dec  flexibility   []  Dec  ROM   []   pain   [x]  Dec strength  [x]   weakness    []   Dec balance   []   swelling   []   inflammation   []   Spasm    []  posture/alignment    []   Other:  Plan: [x]   Continue Plan of Care        []   Add:         []   Discharge after next visit    []   Discharge to HEP   []  Other:  Date  7/16 7/17 7/24 8/10 8/22 8/29 8/31     UBE  8' L3 8' L3 8' L3 8' L3 8' L4 8'     wall walk      1.5# 2x10 1.5# 2x10     Pulley  2x5' 2x5' 2x5; 2x5 2x5' 2x5'     Flexion Cane            Scaption            Abduction            T-Band IR  BTB 3x10  BTB 3x10  BTB 3x12  BTB 3x12  Black 3x10  Black 3x10      T-Band ER  0/ 45/  90   YTB 3x10  YTB 3x10  YTB 3x12  YTB 3x12  Black  3x10  Black 3x10      Scap Retraction GTB 3x10  BTB 3x10  BTB 3x10  BTB 3x12  BTB 3x12  Black 3x10 Black 3x10      Shoulder Extension GTB  3x10  BTB 3x10  BTB 3x10  BTB 3x12  BTB 3x12  Black 3x10  Black 3x10      Lat Pulls  30# 3x10  30# 3x10  30# 3x10  30# 3x12  33# 3x10  33# 3x10     F. M.  Rows            Tricep Ext            Bicep Curls            Shoulder Shrugs            wall pushups       3x10     S/L ER  x10 w/ assistance 3x10 3x10  --       Horiz Abd @ 90 supine/ diagonals     2x10 GTB       Horiz Abd @ 120 prone/stand            Prone Extension  3#3x10  3# 3x10  3# 3x10  3# 3x12  3# 3x12  4#  3x10     Prone Rows  3# 3x10  3# 3x10  3# /3x10  3# 3x12  3# 3x12  4# 3x10     Serratus Punch 2x10           PNFD2 Flex/Ext            Ball roll  x10 x10 x20 x20 x20 x20     Perturbations  2x30 90 deg supine 2x30 90 deg supine 2x30   2x30       sidelying flexion     Beige 2x10       Lower trap lift off       2x10 YTB 2x10     Shoulder stabilization pull      3 way x10 ea YTB 3 way 2x10 ea                 PROM- all planes/ all directions/ grade             Ultrasound/ Infrared            HP/CP  with/without IFC                 PT Billing Documentation Date of Onset: 03/15/17 Visit Number: 7 Therapeutic Procedure CPT 97110 : 55 minutes     Total Treatment Time: 55 minutes     I personally performed the service, incident to.  (I2)   Rollene Bellman, PTA

## 2017-01-30 NOTE — Progress Notes (Signed)
 [facility]                    PT Daily Progress Note  Patient's Name:Scott Yoder                        MRN #[id number]      Date:01/30/2017  Visit Number: 11  Progress Related to Long and Short Term Goals:                                                                                            [] AROM:             [] PROM:     [] Strength:                                Pain -     while- [] at rest    [] During activity:  []  Functional goal:   []  Other:    []  Balance:     Patient / Family Education:    []  Progressed HEP to include:  []  Reviewed previous HEP:   ___________________________________________________________________________________   Skilled Service Provided:  To: []  right   [x]  left     []  bilateral    []   core/body stability and balance  []  neck   [x]   shoulder   []  upper arm/  []  elbow   []  lower arm  []  wrist   []  hand   []   fingers  []  back     []  hip    []  upper leg    []  knee  []  lower leg   []   ankle  foot             Ther Ex Therapeutic Procedure CPT 97110 : 50 minutes         Manual Tx       NeuroMusc          Ther Act         ADL/HEP          Gait Trng            Mech Trax    min        []  Inc ROM [] Inc joint mob []  Inc ROM [] Inc ROM []  Inc Posture [] Inc Bal [] Dec pain  []  Inc Flex CHS Inc Flex []  Inc Flex [] Inc Flex [] Dec pain [] Dec pain [] Inc align  []  Inc  Bal []  Inc ROM [] Inc Strength  [] Inc Strength          [] Inc Bal [] Normalized Gait  [] Inc mob   [x]  Inc Strength       [] Astym [] Inc [employer] Bal [] Prevent     Injury    Progressive     E  HEP    Exercise          []   [] tension/spasms/soft tissue Mob [] Inc pain   During: [] Lifting [] Reaching [] Bending [] Sitting [] Sit to stand [] Twisting [] Pulling [] Pushing [] Inc function    [x] Inc Posture [] Correct malaign [] Dec tone  []  Progressive  functional HEP               Subjective: The patient does not have a follow up with dr at this time, he  feels that he would like to continue as long as he sees progress. Treatment notes:  Increased resistance with theraband exercises today to promote increased RC strengthening. Added shoulder flexion with emphasis to reduce compensation with shoulder shrug. Free motion pulldown with emphasis on eccentric contraction to work on [employer].  Assessment of Treatment: Pt compensates a lot with elevation of the L shoulder, with excessive shoulder shrugging. Pt fatigues quickly with shoulder flexion.  Pt will continue with therapy 1-2x per week for 4 weeks to improve his endurance and scapular control with shoulder flexion.  Patient's Response to Treatment:  [] Dec pain  [] Inc pain    []  Inc flexibility    [x] Inc endurance   [x]  Inc strength     []  Inc AROM      []  Inc PROM       []  Dec Spasms    [] Inc Posture / alignment     []  Inc Joint Mobility   []  Other:   Functional Improvement Noted:  Increased ability to  []  walk   []  stand for longer periods   []  dress with less help    []  lift    []   reach   []   bend  []  Other:  Remaining Impairment Requiring Continued Treatment:   []  Dec  flexibility   []  Dec  ROM   []   pain   [x]  Dec strength  [x]   weakness    []   Dec balance   []   swelling   []   inflammation   []   Spasm    []  posture/alignment    []   Other:  Plan: [x]   Continue Plan of Care        [x]   Add: 1-2x per week 4 weeks.         []   Discharge after next visit    []   Discharge to HEP   []  Other:  Date  10/3  UBE 8'  wall walk 2#  2x10  Pulley 2x5'  Flexion   Scaption   Abduction   T-Band IR Silver 3x12  T-Band ER  0/ 45/  90  Blue 3x10   Scap Retraction Silver 3x12   Shoulder Extension Silver 3x12   Lat Pulls   F. M. Pulldown 7# 2x10  Shoulder Shrug w/ roll 6# 2x10  Bicep Curls   Shoulder Shrugs   wall pushups   S/L ER   Horiz Abd @ 90 supine/ diagonals Supine GTB 2x10  Shoulder flexion 2x10  Prone Extension    Prone Rows    Serratus Punch   PNFD2 Flex/Ext   Ball roll    Perturbations   sidelying flexion   Lower trap lift off RTB x20  Shoulder stabilization pull RTB x20     PROM- all planes/ all directions/ grade    Ultrasound/ Infrared   HP/CP  with/without IFC        PT Billing Documentation Date of Onset: 03/15/16 Visit Number: 11 Therapeutic Procedure CPT 97110 : 50 minutes     Total Treatment Time: 50 minutes     I personally performed the service, incident to.  (I2)   KATELYN KUHN, PT, DPT

## 2017-08-26 ENCOUNTER — Emergency Department
Admission: EM | Admit: 2017-08-26 | Discharge: 2017-08-26 | Disposition: A | Discharge status: Home / Self Care | Payer: Medicare Other | Attending: Emergency Medicine | Admitting: Emergency Medicine

## 2017-08-26 ENCOUNTER — Other Ambulatory Visit: Payer: Self-pay

## 2017-08-26 DIAGNOSIS — H6981 Other specified disorders of Eustachian tube, right ear: Secondary | ICD-10-CM | POA: Insufficient documentation

## 2017-08-26 DIAGNOSIS — E119 Type 2 diabetes mellitus without complications: Secondary | ICD-10-CM | POA: Diagnosis not present

## 2017-08-26 DIAGNOSIS — Z951 Presence of aortocoronary bypass graft: Secondary | ICD-10-CM | POA: Insufficient documentation

## 2017-08-26 DIAGNOSIS — H6121 Impacted cerumen, right ear: Secondary | ICD-10-CM

## 2017-08-26 DIAGNOSIS — Z7982 Long term (current) use of aspirin: Secondary | ICD-10-CM | POA: Diagnosis not present

## 2017-08-26 DIAGNOSIS — Z79899 Other long term (current) drug therapy: Secondary | ICD-10-CM | POA: Diagnosis not present

## 2017-08-26 DIAGNOSIS — F17200 Nicotine dependence, unspecified, uncomplicated: Secondary | ICD-10-CM | POA: Diagnosis not present

## 2017-08-26 DIAGNOSIS — I2510 Atherosclerotic heart disease of native coronary artery without angina pectoris: Secondary | ICD-10-CM | POA: Insufficient documentation

## 2017-08-26 DIAGNOSIS — I10 Essential (primary) hypertension: Secondary | ICD-10-CM | POA: Diagnosis not present

## 2017-08-26 DIAGNOSIS — H6991 Unspecified Eustachian tube disorder, right ear: Secondary | ICD-10-CM

## 2017-08-26 DIAGNOSIS — H9192 Unspecified hearing loss, left ear: Secondary | ICD-10-CM | POA: Diagnosis present

## 2017-08-26 DIAGNOSIS — F1210 Cannabis abuse, uncomplicated: Secondary | ICD-10-CM | POA: Insufficient documentation

## 2017-08-26 MED ORDER — PREDNISONE 10 MG PO TABS: 30.0000 mg | ORAL_TABLET | Freq: Every day | ORAL | 0 refills | Status: DC

## 2017-08-26 MED ORDER — FLUTICASONE PROPIONATE 50 MCG/ACT NA SUSP: 2.0000 | Freq: Every day | NASAL | 0 refills | Status: AC

## 2017-08-26 NOTE — ED Triage Notes (Signed)
Pt states he has had issues hearing from the right ear since taking a shower Thursday. Denies pain

## 2017-08-26 NOTE — Discharge Instructions (Addendum)
Is the Flonase as directed.  2 sprays in the each side of your nose per day for at least 5 days.  Over-the-counter Sudafed.  Prednisone 30 mg/day for 3 days this to help with the inflammation of the eustachian tube.

## 2017-08-26 NOTE — ED Notes (Signed)
See triage note  Presents with full feeling in right ear  States he felt that after taking a shower on Thursday  Denies any fever or drainage

## 2017-08-26 NOTE — ED Provider Notes (Signed)
[facility] Emergency Department Provider Note  ____________________________________________   First MD Initiated Contact with Patient 08/26/17 1736     (approximate)  I have reviewed the triage vital signs and the nursing notes.   HISTORY  Chief Complaint Ear Fullness    HPI Scott Yoder. is a 62 y.o. male who presents emergency department complaining of decreased hearing on the left ear.  He states feels like it is full of wax.  He took shower on Thursday and is got worse.  He denies any other symptoms.  Denies any injury to the ear.  Past Medical History:  Diagnosis Date  . Coronary artery disease   . Hernia, inguinal   . High cholesterol   . Hypertension   . Hypokalemia     Patient Active Problem List   Diagnosis Date Noted  . Atherosclerosis of coronary artery 07/31/2014  . Diabetes (HCC) 07/31/2014  . BP (high blood pressure) 07/31/2014  . Coronary artery disease   . Hernia, inguinal 07/14/2014    Past Surgical History:  Procedure Laterality Date  . CORONARY ARTERY BYPASS GRAFT    . TRACHEOSTOMY      Prior to Admission medications   Medication Sig Start Date End Date Taking? Authorizing Provider  aspirin 81 MG tablet Take 81 mg by mouth daily.    [provider]  carvedilol (COREG) 25 MG tablet Take 2 tablets by mouth 2 (two) times daily.    [provider]  cyclobenzaprine (FLEXERIL) 10 MG tablet Take 10 mg by mouth 3 (three) times daily as needed.    [provider]  DULoxetine (CYMBALTA) 60 MG capsule Take 60 mg by mouth daily.    [provider]  fluticasone (FLONASE) 50 MCG/ACT nasal spray Place 2 sprays into both nostrils daily. 08/26/17   Fisher, Roselyn Bering, PA-C  hydrochlorothiazide (HYDRODIURIL) 25 MG tablet Take 25 mg by mouth daily.    [provider]  lisinopril (PRINIVIL,ZESTRIL) 20 MG tablet Take 20 mg by mouth 2 (two) times daily.    [provider]    nitroGLYCERIN (NITROSTAT) 0.4 MG SL tablet Place 0.4 mg under the tongue 3 (three) times daily as needed. 1 tab every five minutes x 3, as needed x chest pain    [provider]  predniSONE (DELTASONE) 10 MG tablet Take 3 tablets (30 mg total) by mouth daily with breakfast. 08/26/17   [name], PA-C  simvastatin (ZOCOR) 20 MG tablet Take 20 mg by mouth at bedtime.    [provider]    Allergies Patient has no known allergies.  Family History  Problem Relation Age of Onset  . Diabetes Mother   . Hypertension Mother   . Stroke Mother     Social History Social History   Tobacco Use  . Smoking status: Current Some Day Smoker  . Smokeless tobacco: Never Used  Substance Use Topics  . Alcohol use: No  . Drug use: Yes    Types: Marijuana    Comment: Occasional    Review of Systems  Constitutional: No fever/chills Eyes: No visual changes. ENT: No sore throat.  Decreased hearing and possible wax impaction in the right ear Respiratory: Denies cough Genitourinary: Negative for dysuria. Musculoskeletal: Negative for back pain. Skin: Negative for rash.    ____________________________________________   PHYSICAL EXAM:  VITAL SIGNS: ED Triage Vitals  Enc Vitals Group     BP 08/26/17 1718 (!) 204/124     Pulse Rate 08/26/17 1718  77     Resp 08/26/17 1718 18     Temp 08/26/17 1718 98.7 F (37.1 C)     Temp Source 08/26/17 1718 Oral     SpO2 08/26/17 1718 98 %     Weight 08/26/17 1718 175 lb (79.4 kg)     Height 08/26/17 1718  (1.778 m)     Head Circumference --      Peak Flow --      Pain Score 08/26/17 1756 0     Pain Loc --      Pain Edu? --      Excl. in GC? --     Constitutional: Alert and oriented. Well appearing and in no acute distress. Eyes: Conjunctivae are normal.  Head: Atraumatic. Ears: The right ear canal is positive for cerumen impaction.  The ear was irrigated with warm water for wax removal. Nose: No  congestion/rhinnorhea. Mouth/Throat: Mucous membranes are moist.   Cardiovascular: Normal rate, regular rhythm. Respiratory: Normal respiratory effort.  No retractions GU: deferred Musculoskeletal: FROM all extremities, warm and well perfused Neurologic:  Normal speech and language.  Skin:  Skin is warm, dry and intact. No rash noted. Psychiatric: Mood and affect are normal. Speech and behavior are normal.  ____________________________________________   LABS (all labs ordered are listed, but only abnormal results are displayed)  Labs Reviewed - No data to display ____________________________________________   ____________________________________________  RADIOLOGY    ____________________________________________   PROCEDURES  Procedure(s) performed: The right ear was irrigated with warm water.  The wax was removed.  Patient tolerated procedure well.  The TM is dull.  There is no bleeding in the ear canal the TM is intact post procedure.  Procedures    ____________________________________________   INITIAL IMPRESSION / ASSESSMENT AND PLAN / ED COURSE  Pertinent labs & imaging results that were available during my care of the patient were reviewed by me and considered in my medical decision making (see chart for details).  Patient is 62 year old male presented emergency department complaining of decreased hearing from the right ear.  He states that he has wax in the ear  On physical exam there is a cerumen impaction to the right ear canal.  The earwax was removed with irrigation.  Explained to the patient that he still had some fluid behind the TM.  He was given a prescription for Flonase, prednisone 30 mg daily for 3 days.  And he is to take a very low dose of Sudafed to help open up the eustachian tube.  He states he understands to comply with our instructions.  He is to use over-the-counter Debrox weekly.  He was discharged in stable condition     As part of my  medical decision making, I reviewed the following data within the electronic MEDICAL RECORD NUMBER Nursing notes reviewed and incorporated, Old chart reviewed, Notes from prior ED visits and [place] Controlled Substance Database  ____________________________________________   FINAL CLINICAL IMPRESSION(S) / ED DIAGNOSES  Final diagnoses:  Impacted cerumen of right ear  Dysfunction of Eustachian tube, right      NEW MEDICATIONS STARTED DURING THIS VISIT:  Discharge Medication List as of 08/26/2017  5:54 PM       Note:  This document was prepared using Dragon voice recognition software and may include unintentional dictation errors.    Faythe Ghee, PA-C 08/26/17 Tillie Rung, MD 08/26/17 2043

## 2017-08-28 ENCOUNTER — Telehealth: Payer: Self-pay | Admitting: Emergency Medicine

## 2017-09-02 ENCOUNTER — Emergency Department
Admission: EM | Admit: 2017-09-02 | Discharge: 2017-09-02 | Disposition: A | Discharge status: Home / Self Care | Payer: Medicare Other | Attending: Emergency Medicine | Admitting: Emergency Medicine

## 2017-09-02 ENCOUNTER — Encounter: Payer: Self-pay | Admitting: Emergency Medicine

## 2017-09-02 DIAGNOSIS — Z79899 Other long term (current) drug therapy: Secondary | ICD-10-CM | POA: Insufficient documentation

## 2017-09-02 DIAGNOSIS — I2510 Atherosclerotic heart disease of native coronary artery without angina pectoris: Secondary | ICD-10-CM | POA: Diagnosis not present

## 2017-09-02 DIAGNOSIS — Z951 Presence of aortocoronary bypass graft: Secondary | ICD-10-CM | POA: Diagnosis not present

## 2017-09-02 DIAGNOSIS — H6121 Impacted cerumen, right ear: Secondary | ICD-10-CM | POA: Diagnosis not present

## 2017-09-02 DIAGNOSIS — Z7982 Long term (current) use of aspirin: Secondary | ICD-10-CM | POA: Diagnosis not present

## 2017-09-02 DIAGNOSIS — F17200 Nicotine dependence, unspecified, uncomplicated: Secondary | ICD-10-CM | POA: Diagnosis not present

## 2017-09-02 DIAGNOSIS — E119 Type 2 diabetes mellitus without complications: Secondary | ICD-10-CM | POA: Diagnosis not present

## 2017-09-02 DIAGNOSIS — I10 Essential (primary) hypertension: Secondary | ICD-10-CM | POA: Diagnosis not present

## 2017-09-02 DIAGNOSIS — H9391 Unspecified disorder of right ear: Secondary | ICD-10-CM | POA: Diagnosis present

## 2017-09-02 MED ORDER — DOCUSATE SODIUM 50 MG/5ML PO LIQD
50.0000 mg | Freq: Once | ORAL | Status: AC
  Administered 2017-09-02: 50 mg via ORAL
  Filled 2017-09-02: qty 10

## 2017-09-02 NOTE — ED Provider Notes (Signed)
[facility] Emergency Department Provider Note  ____________________________________________   First MD Initiated Contact with Patient 09/02/17 1243     (approximate)  I have reviewed the triage vital signs and the nursing notes.   HISTORY  Chief Complaint Ear wax removal   HPI Scott Yoder. is a 62 y.o. male is here with complaint of decreased hearing and fullness in his right ear.  He was seen in the emergency department last month for the same complaint of his left ear.  Patient has not used any over-the-counter medication for this.  He also states he has not taken his blood pressure medication today.   Past Medical History:  Diagnosis Date  . Coronary artery disease   . Hernia, inguinal   . High cholesterol   . Hypertension   . Hypokalemia     Patient Active Problem List   Diagnosis Date Noted  . Atherosclerosis of coronary artery 07/31/2014  . Diabetes (HCC) 07/31/2014  . BP (high blood pressure) 07/31/2014  . Coronary artery disease   . Hernia, inguinal 07/14/2014    Past Surgical History:  Procedure Laterality Date  . CORONARY ARTERY BYPASS GRAFT    . TRACHEOSTOMY      Prior to Admission medications   Medication Sig Start Date End Date Taking? Authorizing Provider  aspirin 81 MG tablet Take 81 mg by mouth daily.    [provider]  carvedilol (COREG) 25 MG tablet Take 2 tablets by mouth 2 (two) times daily.    [provider]  cyclobenzaprine (FLEXERIL) 10 MG tablet Take 10 mg by mouth 3 (three) times daily as needed.    [provider]  DULoxetine (CYMBALTA) 60 MG capsule Take 60 mg by mouth daily.    [provider]  fluticasone (FLONASE) 50 MCG/ACT nasal spray Place 2 sprays into both nostrils daily. 08/26/17   Fisher, Roselyn Bering, PA-C  hydrochlorothiazide (HYDRODIURIL) 25 MG tablet Take 25 mg by mouth daily.    [provider]  lisinopril (PRINIVIL,ZESTRIL) 20 MG tablet Take 20 mg  by mouth 2 (two) times daily.    [provider]  nitroGLYCERIN (NITROSTAT) 0.4 MG SL tablet Place 0.4 mg under the tongue 3 (three) times daily as needed. 1 tab every five minutes x 3, as needed x chest pain    [provider]  simvastatin (ZOCOR) 20 MG tablet Take 20 mg by mouth at bedtime.    [provider]    Allergies Patient has no known allergies.  Family History  Problem Relation Age of Onset  . Diabetes Mother   . Hypertension Mother   . Stroke Mother     Social History Social History   Tobacco Use  . Smoking status: Current Some Day Smoker  . Smokeless tobacco: Never Used  Substance Use Topics  . Alcohol use: No  . Drug use: Yes    Types: Marijuana    Comment: Occasional    Review of Systems Constitutional: No fever/chills ENT: Positive for right ear pain. Cardiovascular: Denies chest pain. Respiratory: Denies shortness of breath. Musculoskeletal: Negative for back pain. Skin: Negative for rash. Neurological: Negative for headaches, focal weakness or numbness. ____________________________________________   PHYSICAL EXAM:  VITAL SIGNS: ED Triage Vitals [09/02/17 1120]  Enc Vitals Group     BP (!) 179/105     Pulse Rate 72     Resp 16     Temp 97.6 F (36.4 C)     Temp Source Oral  SpO2 96 %     Weight 175 lb (79.4 kg)     Height  (1.778 m)     Head Circumference      Peak Flow      Pain Score 0     Pain Loc      Pain Edu?      Excl. in GC?    Constitutional: Alert and oriented. Well appearing and in no acute distress. Eyes: Conjunctivae are normal.  Head: Atraumatic. Nose: No congestion/rhinnorhea.  Right EAC is occluded with cerumen.  After lavage EAC and TM are clear. Neck: No stridor.   Cardiovascular: Normal rate, regular rhythm. Grossly normal heart sounds.  Good peripheral circulation. Respiratory: Normal respiratory effort.  No retractions. Lungs CTAB. Musculoskeletal: No difficulty with movement  upper or lower extremities. Neurologic:  Normal speech and language. No gross focal neurologic deficits are appreciated.  Skin:  Skin is warm, dry and intact.  Psychiatric: Mood and affect are normal. Speech and behavior are normal.  ____________________________________________   LABS (all labs ordered are listed, but only abnormal results are displayed)  Labs Reviewed - No data to display  PROCEDURES  Procedure(s) performed: None  Procedures  Critical Care performed: No  ____________________________________________   INITIAL IMPRESSION / ASSESSMENT AND PLAN / ED COURSE Colace was placed in the right ear canal and was lavaged with normal saline.  Area was cleaned stated that he could hear much better.  He is encouraged to follow-up with his PCP if any continued problems.  He is also encouraged to take his blood pressure medication every day.  ____________________________________________   FINAL CLINICAL IMPRESSION(S) / ED DIAGNOSES  Final diagnoses:  Impacted cerumen of right ear     ED Discharge Orders    None       Note:  This document was prepared using Dragon voice recognition software and may include unintentional dictation errors.    Tommi Rumps, PA-C 09/02/17 1430    Minna Antis, MD 09/02/17 1524

## 2017-09-02 NOTE — ED Triage Notes (Signed)
Pt reports being seen here last week due to poor ability to hear out of right ear. Ear was build up was discovered and patient states he is back because it "wasn't all taken out and I can't hear like I can out of the left ear." Pt denies any pain.

## 2017-09-02 NOTE — ED Notes (Signed)
Ear irrigation done with was removed. Patient tol well. Able to hear.

## 2017-09-02 NOTE — Discharge Instructions (Addendum)
Follow-up with your primary care provider if any continued problems with your ears.  Remember to take your blood pressure medication every day as her blood pressure was elevated at 179/105.

## 2018-09-24 ENCOUNTER — Emergency Department
Admission: EM | Admit: 2018-09-24 | Discharge: 2018-09-24 | Disposition: A | Discharge status: Home / Self Care | Payer: Medicare Other | Attending: Emergency Medicine | Admitting: Emergency Medicine

## 2018-09-24 ENCOUNTER — Other Ambulatory Visit: Payer: Self-pay

## 2018-09-24 DIAGNOSIS — I2510 Atherosclerotic heart disease of native coronary artery without angina pectoris: Secondary | ICD-10-CM | POA: Insufficient documentation

## 2018-09-24 DIAGNOSIS — R21 Rash and other nonspecific skin eruption: Secondary | ICD-10-CM | POA: Diagnosis not present

## 2018-09-24 DIAGNOSIS — H9191 Unspecified hearing loss, right ear: Secondary | ICD-10-CM | POA: Diagnosis present

## 2018-09-24 DIAGNOSIS — F1210 Cannabis abuse, uncomplicated: Secondary | ICD-10-CM | POA: Diagnosis not present

## 2018-09-24 DIAGNOSIS — E119 Type 2 diabetes mellitus without complications: Secondary | ICD-10-CM | POA: Diagnosis not present

## 2018-09-24 DIAGNOSIS — I10 Essential (primary) hypertension: Secondary | ICD-10-CM | POA: Insufficient documentation

## 2018-09-24 DIAGNOSIS — H6121 Impacted cerumen, right ear: Secondary | ICD-10-CM

## 2018-09-24 DIAGNOSIS — F17200 Nicotine dependence, unspecified, uncomplicated: Secondary | ICD-10-CM | POA: Insufficient documentation

## 2018-09-24 DIAGNOSIS — Z951 Presence of aortocoronary bypass graft: Secondary | ICD-10-CM | POA: Diagnosis not present

## 2018-09-24 DIAGNOSIS — Z79899 Other long term (current) drug therapy: Secondary | ICD-10-CM | POA: Insufficient documentation

## 2018-09-24 MED ORDER — KETOCONAZOLE 2 % EX CREA: 1.0000 "application " | TOPICAL_CREAM | Freq: Two times a day (BID) | CUTANEOUS | 1 refills | Status: AC

## 2018-09-24 MED ORDER — HYDROCORTISONE 2.5 % EX OINT: TOPICAL_OINTMENT | Freq: Two times a day (BID) | CUTANEOUS | 0 refills | Status: AC

## 2018-09-24 MED ORDER — CARBAMIDE PEROXIDE 6.5 % OT SOLN
10.0000 [drp] | Freq: Once | OTIC | Status: AC
  Administered 2018-09-24: 10 [drp] via OTIC
  Filled 2018-09-24: qty 15

## 2018-09-24 NOTE — ED Notes (Signed)
Ear drops placed to right ear.

## 2018-09-24 NOTE — ED Notes (Signed)
Reports right ear clogged few days ago has not cleared now having pain to right ear. Denies f/c/d/n/v. awaiting to be seen.

## 2018-09-24 NOTE — Discharge Instructions (Addendum)
Please follow-up with your primary care provider if the rash does not improve with the cream prescribed today.  Purchase over-the-counter wax softening solution and use as directed on the package.  This will prevent wax buildup in your ear.

## 2018-09-24 NOTE — ED Provider Notes (Signed)
[facility] Emergency Department Provider Note ____________________________________________  Time seen: Approximately 12:16 PM  I have reviewed the triage vital signs and the nursing notes.   HISTORY  Chief Complaint Hearing Loss    HPI Scott Yoder. is a 63 y.o. male who presents to the emergency department for treatment and evaluation of decreasing ability to hear out of the right ear and skin rash.  Decreasing ability to hear is been present for the past 2 weeks.  He denies pain in the ear.  Skin rash is been present for approximately 6 months or more and is described as burning and itching.  No relief with triple antibiotic ointment.   Past Medical History:  Diagnosis Date  . Coronary artery disease   . Hernia, inguinal   . High cholesterol   . Hypertension   . Hypokalemia     Patient Active Problem List   Diagnosis Date Noted  . Atherosclerosis of coronary artery 07/31/2014  . Diabetes ([place]) 07/31/2014  . BP (high blood pressure) 07/31/2014  . Coronary artery disease   . Hernia, inguinal 07/14/2014    Past Surgical History:  Procedure Laterality Date  . CORONARY ARTERY BYPASS GRAFT    . TRACHEOSTOMY      Prior to Admission medications   Medication Sig Start Date End Date Taking? Authorizing Provider  aspirin 81 MG tablet Take 81 mg by mouth daily.    [provider]  carvedilol (COREG) 25 MG tablet Take 2 tablets by mouth 2 (two) times daily.    [provider]  cyclobenzaprine (FLEXERIL) 10 MG tablet Take 10 mg by mouth 3 (three) times daily as needed.    [provider]  DULoxetine (CYMBALTA) 60 MG capsule Take 60 mg by mouth daily.    [provider]  fluticasone (FLONASE) 50 MCG/ACT nasal spray Place 2 sprays into both nostrils daily. 08/26/17   Fisher, Linden Dolin, PA-C  hydrochlorothiazide (HYDRODIURIL) 25 MG tablet Take 25 mg by mouth daily.    [provider]  hydrocortisone 2.5 %  ointment Apply topically 2 (two) times daily. 09/24/18   Endia Moncur, Johnette Abraham B, FNP  ketoconazole (NIZORAL) 2 % cream Apply 1 application topically 2 (two) times daily for 14 days. 09/24/18 10/08/18  Idris Edmundson, Johnette Abraham B, FNP  lisinopril (PRINIVIL,ZESTRIL) 20 MG tablet Take 20 mg by mouth 2 (two) times daily.    [provider]  nitroGLYCERIN (NITROSTAT) 0.4 MG SL tablet Place 0.4 mg under the tongue 3 (three) times daily as needed. 1 tab every five minutes x 3, as needed x chest pain    [provider]  simvastatin (ZOCOR) 20 MG tablet Take 20 mg by mouth at bedtime.    [provider]    Allergies Patient has no known allergies.  Family History  Problem Relation Age of Onset  . Diabetes Mother   . Hypertension Mother   . Stroke Mother     Social History Social History   Tobacco Use  . Smoking status: Current Some Day Smoker  . Smokeless tobacco: Never Used  Substance Use Topics  . Alcohol use: No  . Drug use: Yes    Types: Marijuana    Comment: Occasional    Review of Systems Constitutional: Negative for fever.  Positive for decreased ability to hear from right ear(s). Eyes: Negative for discharge or drainage. ENT:       Negative for otalgia in either ear(s).      Negative for rhinorrhea or congestion.  Negative for sore throat. Gastrointestinal: Negative for nausea, vomiting, or diarrhea. Musculoskeletal: Negative for myalgias. Skin: Positive for rash, lesions, or wounds. Neurological: Negative for paresthesias. ____________________________________________   PHYSICAL EXAM:  VITAL SIGNS: ED Triage Vitals  Enc Vitals Group     BP 09/24/18 1108 (!) 174/110     Pulse Rate 09/24/18 1108 77     Resp 09/24/18 1108 17     Temp 09/24/18 1108 98.4 F (36.9 C)     Temp Source 09/24/18 1108 Oral     SpO2 09/24/18 1108 99 %     Weight 09/24/18 1109 175 lb (79.4 kg)     Height 09/24/18 1109 5' 11"  (1.803 m)     Head Circumference --      Peak Flow --       Pain Score 09/24/18 1109 0     Pain Loc --      Pain Edu? --      Excl. in [place]? --     Constitutional: Well appearing. Eyes: Conjunctivae are clear without discharge or drainage. Ears:       Right TM: Cerumen impaction.      Left TM: TM is normal. Head: Atraumatic. Nose: No rhinorrhea or sinus pain on percussion. Mouth/Throat: Oropharynx normal. Tonsils  without exudate. Hematological/Lymphatic/Immunilogical: No palpable anterior cervical lymphadenopathy. Cardiovascular: Heart rate and rhythm are regular without murmur, gallop, or rub appreciated. Respiratory: Breath sounds are clear throughout to auscultation.  Neurologic:  Alert and oriented x 4. Skin: Papular plaque-like rash that appears excoriated is present on the right dorsal aspect of the forearm and right lower extremity between ankle and knee. ____________________________________________   LABS (all labs ordered are listed, but only abnormal results are displayed)  Labs Reviewed - No data to display ____________________________________________   RADIOLOGY  Not indicated ____________________________________________   PROCEDURES  Procedure(s) performed:   Procedures  ____________________________________________   INITIAL IMPRESSION / ASSESSMENT AND PLAN / ED COURSE  63 year old male presenting to the emergency department for treatment and evaluation of decrease in hearing and rash.  While here, ear irrigation was completed after Debrox was left in the ear for 10 or 15 minutes.  ----------------------------------------- 1:17 PM on 09/24/2018 -----------------------------------------  Large amount of cerumen removed with irrigation.  Patient states he is now able to hear normally.  He will be discharged home and advised to purchase an over-the-counter ear wax softening kit.  He is also to use the ketoconazole as prescribed and follow-up with primary care if not improving over the next 2 weeks.  He was  encouraged to return to the emergency department for symptoms of change or worsen if unable to schedule an appointment.  Pertinent labs & imaging results that were available during my care of the patient were reviewed by me and considered in my medical decision making (see chart for details). ____________________________________________   FINAL CLINICAL IMPRESSION(S) / ED DIAGNOSES  Final diagnoses:  Impacted cerumen of right ear  Rash and nonspecific skin eruption    ED Discharge Orders         Ordered    ketoconazole (NIZORAL) 2 % cream  2 times daily     09/24/18 1312    hydrocortisone 2.5 % ointment  2 times daily     09/24/18 1319          If controlled substance prescribed during this visit, 12 month history viewed on the [place] prior to issuing an initial prescription for Schedule II or III opiod.   Note:  This document was prepared using Dragon voice recognition software and may include unintentional dictation errors.    Victorino Dike, FNP 09/24/18 [place]    Carrie Mew, MD 09/24/18 [phone]

## 2018-09-24 NOTE — ED Triage Notes (Signed)
Pt /o loss of hearing in the right ear for the past 2 weeks, I think I have wax buildup

## 2020-06-13 ENCOUNTER — Other Ambulatory Visit: Payer: Self-pay

## 2020-06-13 ENCOUNTER — Encounter: Payer: Self-pay | Admitting: Emergency Medicine

## 2020-06-13 ENCOUNTER — Emergency Department
Admission: EM | Admit: 2020-06-13 | Discharge: 2020-06-13 | Disposition: A | Discharge status: Home / Self Care | Payer: Medicare Other | Attending: Emergency Medicine | Admitting: Emergency Medicine

## 2020-06-13 DIAGNOSIS — F17200 Nicotine dependence, unspecified, uncomplicated: Secondary | ICD-10-CM | POA: Diagnosis not present

## 2020-06-13 DIAGNOSIS — S61412A Laceration without foreign body of left hand, initial encounter: Secondary | ICD-10-CM | POA: Diagnosis not present

## 2020-06-13 DIAGNOSIS — Z7982 Long term (current) use of aspirin: Secondary | ICD-10-CM | POA: Diagnosis not present

## 2020-06-13 DIAGNOSIS — I10 Essential (primary) hypertension: Secondary | ICD-10-CM | POA: Insufficient documentation

## 2020-06-13 DIAGNOSIS — Z79899 Other long term (current) drug therapy: Secondary | ICD-10-CM | POA: Diagnosis not present

## 2020-06-13 DIAGNOSIS — Z23 Encounter for immunization: Secondary | ICD-10-CM | POA: Diagnosis not present

## 2020-06-13 DIAGNOSIS — Z951 Presence of aortocoronary bypass graft: Secondary | ICD-10-CM | POA: Insufficient documentation

## 2020-06-13 DIAGNOSIS — I2510 Atherosclerotic heart disease of native coronary artery without angina pectoris: Secondary | ICD-10-CM | POA: Diagnosis not present

## 2020-06-13 DIAGNOSIS — S6992XA Unspecified injury of left wrist, hand and finger(s), initial encounter: Secondary | ICD-10-CM | POA: Diagnosis present

## 2020-06-13 DIAGNOSIS — Y93G3 Activity, cooking and baking: Secondary | ICD-10-CM | POA: Diagnosis not present

## 2020-06-13 DIAGNOSIS — W260XXA Contact with knife, initial encounter: Secondary | ICD-10-CM | POA: Diagnosis not present

## 2020-06-13 DIAGNOSIS — I2583 Coronary atherosclerosis due to lipid rich plaque: Secondary | ICD-10-CM | POA: Insufficient documentation

## 2020-06-13 MED ORDER — CEPHALEXIN 500 MG PO CAPS: 500.0000 mg | ORAL_CAPSULE | Freq: Three times a day (TID) | ORAL | 0 refills | Status: AC

## 2020-06-13 MED ORDER — TETANUS-DIPHTH-ACELL PERTUSSIS 5-2.5-18.5 LF-MCG/0.5 IM SUSY
0.5000 mL | PREFILLED_SYRINGE | Freq: Once | INTRAMUSCULAR | Status: AC
  Administered 2020-06-13: 0.5 mL via INTRAMUSCULAR
  Filled 2020-06-13: qty 0.5

## 2020-06-13 MED ORDER — LIDOCAINE HCL (PF) 1 % IJ SOLN
5.0000 mL | Freq: Once | INTRAMUSCULAR | Status: AC
  Administered 2020-06-13: 5 mL
  Filled 2020-06-13: qty 5

## 2020-06-13 NOTE — ED Triage Notes (Signed)
Pt to ED via POV with c/o laceration to L hand, pt states was trying to cut a brownie out of abowl this morning when the knife slipped. Pt with laceration to palm and to posterior hand, states he thinks the knife went through his hand. Pt with full ROM to fingers and full sensation to his fingers.

## 2020-06-13 NOTE — ED Provider Notes (Signed)
[facility] Emergency Department Provider Note ____________________________________________  Time seen: 1818  I have reviewed the triage vital signs and the nursing notes.  HISTORY  Chief Complaint  Laceration   HPI Scott Yoder. is a 65 y.o. male presents himself to the ED for evaluation of an accidental laceration to the palm and dorsum of the left hand.   Reports a stab injury to the palm of the hand as he was trying to cut some brownies out of the pain.  He described his right hand slipping, causing a puncture wound to the palm of his left hand and a through and through laceration on the dorsal side.  Patient denies any other injury at the time.  He reports normal range of motion and sensation.   Past Medical History:  Diagnosis Date  . Coronary artery disease   . Hernia, inguinal   . High cholesterol   . Hypertension   . Hypokalemia     Patient Active Problem List   Diagnosis Date Noted  . Atherosclerosis of coronary artery 07/31/2014  . Diabetes (HCC) 07/31/2014  . BP (high blood pressure) 07/31/2014  . Coronary artery disease   . Hernia, inguinal 07/14/2014    Past Surgical History:  Procedure Laterality Date  . CORONARY ARTERY BYPASS GRAFT    . TRACHEOSTOMY      Prior to Admission medications   Medication Sig Start Date End Date Taking? Authorizing Provider  cephALEXin (KEFLEX) 500 MG capsule Take 1 capsule (500 mg total) by mouth 3 (three) times daily for 7 days. 06/13/20 06/20/20 Yes [employer], Charlesetta Ivory, PA-C  aspirin 81 MG tablet Take 81 mg by mouth daily.    [provider]  carvedilol (COREG) 25 MG tablet Take 2 tablets by mouth 2 (two) times daily.    [provider]  cyclobenzaprine (FLEXERIL) 10 MG tablet Take 10 mg by mouth 3 (three) times daily as needed.    [provider]  DULoxetine (CYMBALTA) 60 MG capsule Take 60 mg by mouth daily.    [provider]  fluticasone (FLONASE) 50  MCG/ACT nasal spray Place 2 sprays into both nostrils daily. 08/26/17   Fisher, Roselyn Bering, PA-C  hydrochlorothiazide (HYDRODIURIL) 25 MG tablet Take 25 mg by mouth daily.    [provider]  hydrocortisone 2.5 % ointment Apply topically 2 (two) times daily. 09/24/18   Triplett, Cari B, FNP  lisinopril (PRINIVIL,ZESTRIL) 20 MG tablet Take 20 mg by mouth 2 (two) times daily.    [provider]  nitroGLYCERIN (NITROSTAT) 0.4 MG SL tablet Place 0.4 mg under the tongue 3 (three) times daily as needed. 1 tab every five minutes x 3, as needed x chest pain    [provider]  simvastatin (ZOCOR) 20 MG tablet Take 20 mg by mouth at bedtime.    [provider]    Allergies Patient has no known allergies.  Family History  Problem Relation Age of Onset  . Diabetes Mother   . Hypertension Mother   . Stroke Mother     Social History Social History   Tobacco Use  . Smoking status: Current Some Day Smoker  . Smokeless tobacco: Never Used  Substance Use Topics  . Alcohol use: No  . Drug use: Yes    Types: Marijuana    Comment: Occasional    Review of Systems  Constitutional: Negative for fever. Eyes: Negative for visual changes. ENT: Negative for sore throat. Cardiovascular: Negative for chest pain. Respiratory:  Negative for shortness of breath. Gastrointestinal: Negative for abdominal pain, vomiting and diarrhea. Genitourinary: Negative for dysuria. Musculoskeletal: Negative for back pain. Skin: Negative for rash.  Left hand laceration as above. Neurological: Negative for headaches, focal weakness or numbness. ____________________________________________  PHYSICAL EXAM:  VITAL SIGNS: ED Triage Vitals  Enc Vitals Group     BP 06/13/20 1539 (!) 199/115     Pulse Rate 06/13/20 1539 79     Resp 06/13/20 1539 20     Temp 06/13/20 1539 99.2 F (37.3 C)     Temp Source 06/13/20 1539 Oral     SpO2 06/13/20 1539 100 %     Weight 06/13/20 1538 175 lb  (79.4 kg)     Height 06/13/20 1538 5\' 11"  (1.803 m)     Head Circumference --      Peak Flow --      Pain Score 06/13/20 1538 7     Pain Loc --      Pain Edu? --      Excl. in [place]? --     Constitutional: Alert and oriented. Well appearing and in no distress. Head: Normocephalic and atraumatic. Eyes: Conjunctivae are normal. Normal extraocular movements Cardiovascular: Normal rate, regular rhythm. Normal distal pulses and cap refill. Respiratory: Normal respiratory effort. No wheezes/rales/rhonchi. Musculoskeletal: Normal composite fist on the left.  Patient with a 1 cm palm laceration with extrusion of palmar fat noted.  He has a 0.5 cm laceration to the dorsum of the hand between the index and middle fingers.  Nontender with normal range of motion in all extremities.  Neurologic: New nerves II to XII grossly intact.  Normal interesting opposition testing noted.  Normal gait without ataxia. Normal speech and language. No gross focal neurologic deficits are appreciated. Skin:  Skin is warm, dry and intact. No rash noted. Psychiatric: Mood and affect are normal. Patient exhibits appropriate insight and judgment. ____________________________________________  PROCEDURES  Tdap 0.5 ml IM  .02/16/22Laceration Repair  Date/Time: 06/13/2020 7:02 PM Performed by: 06/15/2020, PA-C Authorized by: Lissa Hoard, PA-C   Consent:    Consent obtained:  Verbal   Consent given by:  Patient   Risks, benefits, and alternatives were discussed: yes     Risks discussed:  Infection and pain   Alternatives discussed:  No treatment Universal protocol:    Procedure explained and questions answered to patient or proxy's satisfaction: yes     Site/side marked: yes     Patient identity confirmed:  Verbally with patient Anesthesia:    Anesthesia method:  Local infiltration   Local anesthetic:  Lidocaine 1% w/o epi Laceration details:    Location:  Hand   Hand location:  L palm (& L hand,  dorsum)   Length (cm):  1.5 (1.5 dorsum, 1 palm)   Depth (mm):  3 Pre-procedure details:    Preparation:  Patient was prepped and draped in usual sterile fashion Exploration:    Contaminated: no   Treatment:    Area cleansed with:  Povidone-iodine   Amount of cleaning:  Standard   Irrigation solution:  Sterile saline   Irrigation method:  Syringe   Debridement:  None   Undermining:  None Skin repair:    Repair method:  Sutures   Suture size:  4-0   Suture material:  Nylon   Suture technique:  Simple interrupted   Number of sutures:  4 Approximation:    Approximation:  Close Repair type:    Repair type:  Simple Post-procedure details:    Dressing:  Non-adherent dressing and bulky dressing   Procedure completion:  Tolerated well, no immediate complications   ____________________________________________  INITIAL IMPRESSION / ASSESSMENT AND PLAN / ED COURSE  Patient with ED evaluation of axonal laceration and stab wound to the left palm.  He presents with a palmar wound as well as a small dorsal wound.  Normal gross sensation and no indication of any dysfunction of the palmar flexor tendons.  The wounds are cleansed and appropriately closed with sutures.  Patient is discharged wound care instructions and supplies.  He will see his primary provider local urgent care for suture removal in 10 days.  Scott Krzysztof Reichelt. was evaluated in Emergency Department on 06/13/2020 for the symptoms described in the history of present illness. He was evaluated in the context of the global COVID-19 pandemic, which necessitated consideration that the patient might be at risk for infection with the SARS-CoV-2 virus that causes COVID-19. Institutional protocols and algorithms that pertain to the evaluation of patients at risk for COVID-19 are in a state of rapid change based on information released by regulatory bodies including the CDC and federal and state organizations. These policies and algorithms  were followed during the patient's care in the ED. ____________________________________________  FINAL CLINICAL IMPRESSION(S) / ED DIAGNOSES  Final diagnoses:  Laceration of left hand without foreign body, initial encounter      Lissa Hoard, PA-C 06/13/20 2050    Minna Antis, MD 06/13/20 2200

## 2020-06-13 NOTE — Discharge Instructions (Addendum)
Keep wound clean, dry, and covered. Take the antibiotic as discussed. See your provider for suture removal in 10 days for suture removal.

## 2020-06-28 ENCOUNTER — Emergency Department
Admission: EM | Admit: 2020-06-28 | Discharge: 2020-06-28 | Disposition: A | Discharge status: Home / Self Care | Payer: Medicare Other | Attending: Student in an Organized Health Care Education/Training Program | Admitting: Student in an Organized Health Care Education/Training Program

## 2020-06-28 ENCOUNTER — Other Ambulatory Visit: Payer: Self-pay

## 2020-06-28 DIAGNOSIS — X58XXXD Exposure to other specified factors, subsequent encounter: Secondary | ICD-10-CM | POA: Diagnosis not present

## 2020-06-28 DIAGNOSIS — I2510 Atherosclerotic heart disease of native coronary artery without angina pectoris: Secondary | ICD-10-CM | POA: Insufficient documentation

## 2020-06-28 DIAGNOSIS — S61412D Laceration without foreign body of left hand, subsequent encounter: Secondary | ICD-10-CM | POA: Diagnosis not present

## 2020-06-28 DIAGNOSIS — F17200 Nicotine dependence, unspecified, uncomplicated: Secondary | ICD-10-CM | POA: Diagnosis not present

## 2020-06-28 DIAGNOSIS — Z79899 Other long term (current) drug therapy: Secondary | ICD-10-CM | POA: Diagnosis not present

## 2020-06-28 DIAGNOSIS — Z7982 Long term (current) use of aspirin: Secondary | ICD-10-CM | POA: Insufficient documentation

## 2020-06-28 DIAGNOSIS — I10 Essential (primary) hypertension: Secondary | ICD-10-CM | POA: Insufficient documentation

## 2020-06-28 DIAGNOSIS — Z955 Presence of coronary angioplasty implant and graft: Secondary | ICD-10-CM | POA: Insufficient documentation

## 2020-06-28 DIAGNOSIS — E119 Type 2 diabetes mellitus without complications: Secondary | ICD-10-CM | POA: Diagnosis not present

## 2020-06-28 DIAGNOSIS — S6992XD Unspecified injury of left wrist, hand and finger(s), subsequent encounter: Secondary | ICD-10-CM | POA: Diagnosis present

## 2020-06-28 DIAGNOSIS — Z4802 Encounter for removal of sutures: Secondary | ICD-10-CM

## 2020-06-28 NOTE — ED Provider Notes (Signed)
[facility] Emergency Department Provider Note  ____________________________________________   Event Date/Time   First MD Initiated Contact with Patient 06/28/20 1216     (approximate)  I have reviewed the triage vital signs and the nursing notes.   HISTORY  Chief Complaint Suture / Staple Removal   HPI Scott Yoder. is a 65 y.o. male presents to the ED for suture removal.  Denies any difficulty with his sutures.       Past Medical History:  Diagnosis Date  . Coronary artery disease   . Hernia, inguinal   . High cholesterol   . Hypertension   . Hypokalemia     Patient Active Problem List   Diagnosis Date Noted  . Atherosclerosis of coronary artery 07/31/2014  . Diabetes (HCC) 07/31/2014  . BP (high blood pressure) 07/31/2014  . Coronary artery disease   . Hernia, inguinal 07/14/2014    Past Surgical History:  Procedure Laterality Date  . CORONARY ARTERY BYPASS GRAFT    . TRACHEOSTOMY      Prior to Admission medications   Medication Sig Start Date End Date Taking? Authorizing Provider  aspirin 81 MG tablet Take 81 mg by mouth daily.    [provider]  carvedilol (COREG) 25 MG tablet Take 2 tablets by mouth 2 (two) times daily.    [provider]  cyclobenzaprine (FLEXERIL) 10 MG tablet Take 10 mg by mouth 3 (three) times daily as needed.    [provider]  DULoxetine (CYMBALTA) 60 MG capsule Take 60 mg by mouth daily.    [provider]  fluticasone (FLONASE) 50 MCG/ACT nasal spray Place 2 sprays into both nostrils daily. 08/26/17   Fisher, Roselyn Bering, PA-C  hydrochlorothiazide (HYDRODIURIL) 25 MG tablet Take 25 mg by mouth daily.    [provider]  hydrocortisone 2.5 % ointment Apply topically 2 (two) times daily. 09/24/18   Triplett, Cari B, FNP  lisinopril (PRINIVIL,ZESTRIL) 20 MG tablet Take 20 mg by mouth 2 (two) times daily.    [provider]  nitroGLYCERIN (NITROSTAT)  0.4 MG SL tablet Place 0.4 mg under the tongue 3 (three) times daily as needed. 1 tab every five minutes x 3, as needed x chest pain    [provider]  simvastatin (ZOCOR) 20 MG tablet Take 20 mg by mouth at bedtime.    [provider]    Allergies Patient has no known allergies.  Family History  Problem Relation Age of Onset  . Diabetes Mother   . Hypertension Mother   . Stroke Mother     Social History Social History   Tobacco Use  . Smoking status: Current Some Day Smoker  . Smokeless tobacco: Never Used  Substance Use Topics  . Alcohol use: No  . Drug use: Yes    Types: Marijuana    Comment: Occasional    Review of Systems Constitutional: No fever/chills Cardiovascular: Denies chest pain. Respiratory: Denies shortness of breath. Musculoskeletal: Negative for muscle skeletal pain. Skin: Positive for sutures. Neurological: Negative for headaches, focal weakness or numbness. ____________________________________________   PHYSICAL EXAM:  VITAL SIGNS: ED Triage Vitals  Enc Vitals Group     BP 06/28/20 1140 (!) 191/107     Pulse Rate 06/28/20 1140 66     Resp 06/28/20 1140 16     Temp 06/28/20 1140 98.1 F (36.7 C)     Temp Source 06/28/20 1140 Oral     SpO2 06/28/20 1140 98 %  Weight 06/28/20 1142 175 lb (79.4 kg)     Height 06/28/20 1142 5' (1.524 m)     Head Circumference --      Peak Flow --      Pain Score 06/28/20 1143 0     Pain Loc --      Pain Edu? --      Excl. in [place]? --     Constitutional: Alert and oriented. Well appearing and in no acute distress. Eyes: Conjunctivae are normal.  Head: Atraumatic. Neck: No stridor.   Cardiovascular: Normal rate, regular rhythm. Grossly normal heart sounds.  Good peripheral circulation. Respiratory: Normal respiratory effort.  No retractions. Lungs CTAB. Neurologic:  Normal speech and language. No gross focal neurologic deficits are appreciated. No gait instability. Skin:  Skin is warm,  dry.  Well-healed wound left hand volar aspect. Psychiatric: Mood and affect are normal. Speech and behavior are normal.  ____________________________________________   LABS (all labs ordered are listed, but only abnormal results are displayed)  Labs Reviewed - No data to display ____________________________________________  PROCEDURES  Procedure(s) performed (including Critical Care):  Procedures Sutures were removed by provider.  ____________________________________________   INITIAL IMPRESSION / ASSESSMENT AND PLAN / ED COURSE  As part of my medical decision making, I reviewed the following data within the electronic MEDICAL RECORD NUMBER Notes from prior ED visits and [place] Controlled Substance Database  65 year old male presents to the ED for suture removal.  Patient was seen in the ED on 04/12/2021 for laceration.  He denies any continued problems.  Sutures were removed without any complications. ____________________________________________   FINAL CLINICAL IMPRESSION(S) / ED DIAGNOSES  Final diagnoses:  Encounter for removal of sutures     ED Discharge Orders    None      *Please note:  Scott Yoder. was evaluated in Emergency Department on 06/28/2020 for the symptoms described in the history of present illness. He was evaluated in the context of the global COVID-19 pandemic, which necessitated consideration that the patient might be at risk for infection with the SARS-CoV-2 virus that causes COVID-19. Institutional protocols and algorithms that pertain to the evaluation of patients at risk for COVID-19 are in a state of rapid change based on information released by regulatory bodies including the CDC and federal and state organizations. These policies and algorithms were followed during the patient's care in the ED.  Some ED evaluations and interventions may be delayed as a result of limited staffing during and the pandemic.*   Note:  This document was prepared using  Dragon voice recognition software and may include unintentional dictation errors.    Tommi Rumps, PA-C 06/28/20 1244    Willy Eddy, MD 06/28/20 [phone]

## 2020-06-28 NOTE — ED Notes (Signed)
See triage note  Presents for suture removal to left hand  Sutures intact  No redness or swelling noted

## 2020-06-28 NOTE — ED Triage Notes (Signed)
Pt here for suture removal. Pt denies any complaints. 

## 2020-06-28 NOTE — Discharge Instructions (Signed)
Follow-up with your primary care provider or an acute care if any continued problems.  Continue to keep area clean and dry.

## 2021-11-16 ENCOUNTER — Ambulatory Visit: Payer: Medicare Other

## 2021-11-24 ENCOUNTER — Encounter: Payer: Self-pay | Admitting: Nurse Practitioner

## 2021-11-24 ENCOUNTER — Ambulatory Visit: Payer: Self-pay | Admitting: Nurse Practitioner

## 2021-11-24 DIAGNOSIS — Z113 Encounter for screening for infections with a predominantly sexual mode of transmission: Secondary | ICD-10-CM

## 2021-11-24 DIAGNOSIS — Z202 Contact with and (suspected) exposure to infections with a predominantly sexual mode of transmission: Secondary | ICD-10-CM

## 2021-11-24 MED ORDER — METRONIDAZOLE 500 MG PO TABS: 500.0000 mg | ORAL_TABLET | Freq: Two times a day (BID) | ORAL | 0 refills | Status: DC

## 2021-11-24 NOTE — Progress Notes (Signed)
[facility] Department STI clinic/screening visit  Subjective:  Scott Yoder. is a 66 y.o. male being seen today for an STI screening visit. The patient reports they do not have symptoms.    Patient has the following medical conditions:   Patient Active Problem List   Diagnosis Date Noted   Atherosclerosis of coronary artery 07/31/2014   Diabetes (HCC) 07/31/2014   BP (high blood pressure) 07/31/2014   Coronary artery disease    Hernia, inguinal 07/14/2014     Chief Complaint  Patient presents with   SEXUALLY TRANSMITTED DISEASE    Screening and treatment patient states he was exposed to trichomoniasis     HPI  Patient reports to clinic today for STD screening.  Patient reports being asymptomatic.  Patient also reports that he is a contact to [place].    Does the patient or their partner desires a pregnancy in the next year? No  Screening for MPX risk: Does the patient have an unexplained rash? No Is the patient MSM? No Does the patient endorse multiple sex partners or anonymous sex partners? No Did the patient have close or sexual contact with a person diagnosed with MPX? No Has the patient traveled outside the [place] where MPX is endemic? No Is there a high clinical suspicion for MPX-- evidenced by one of the following No  -Unlikely to be chickenpox  -Lymphadenopathy  -Rash that present in same phase of evolution on any given body part   See flowsheet for further details and programmatic requirements.   Immunization History  Administered Date(s) Administered   Tdap 06/13/2020     The following portions of the patient's history were reviewed and updated as appropriate: allergies, current medications, past medical history, past social history, past surgical history and problem list.  Objective:  There were no vitals filed for this visit.  Physical Exam Constitutional:      Appearance: Normal appearance.  HENT:     Head: Normocephalic. No abrasion,  masses or laceration. Hair is normal.     Right Ear: External ear normal.     Left Ear: External ear normal.     Nose: Nose normal.     Mouth/Throat:     Mouth: No oral lesions.     Pharynx: No pharyngeal swelling, oropharyngeal exudate, posterior oropharyngeal erythema or uvula swelling.     Tonsils: No tonsillar exudate or tonsillar abscesses.     Comments: Poor dentition  Eyes:     General: Lids are normal.        Right eye: No discharge.        Left eye: No discharge.     Conjunctiva/sclera: Conjunctivae normal.     Right eye: No exudate.    Left eye: No exudate. Abdominal:     General: Abdomen is flat.     Palpations: Abdomen is soft.     Tenderness: There is no abdominal tenderness. There is no rebound.  Genitourinary:    Pubic Area: No rash or pubic lice.      Penis: Normal and circumcised. No erythema or discharge.      Testes: Normal.        Right: Mass or tenderness not present.        Left: Mass or tenderness not present.     Rectum: Normal.     Comments: Discharge amount: None Color: None Musculoskeletal:     Cervical back: Full passive range of motion without pain, normal range of motion and neck supple.  Lymphadenopathy:  Cervical: No cervical adenopathy.     Right cervical: No superficial, deep or posterior cervical adenopathy.    Left cervical: No superficial, deep or posterior cervical adenopathy.     Upper Body:     Right upper body: No supraclavicular, axillary or epitrochlear adenopathy.     Left upper body: No supraclavicular, axillary or epitrochlear adenopathy.     Lower Body: No right inguinal adenopathy. No left inguinal adenopathy.  Skin:    General: Skin is warm and dry.     Findings: No lesion or rash.  Neurological:     Mental Status: He is alert and oriented to person, place, and time.  Psychiatric:        Attention and Perception: Attention normal.        Mood and Affect: Mood normal.        Speech: Speech normal.        Behavior:  Behavior normal. Behavior is cooperative.       Assessment and Plan:  Scott Kanin Lia. is a 66 y.o. male presenting to the [facility] Department for STI screening  1. Screening examination for venereal disease -66 year old male in clinic today for STD screening. -Patient does not have STI symptoms Patient accepted all screenings including  urine CT/GC and bloodwork for HIV/RPR.  Patient meets criteria for HepB screening? Yes. Ordered? Yes Patient meets criteria for HepC screening? Yes. Ordered? Yes Recommended condom use with all sex Discussed importance of condom use for STI prevent  Discussed time line for State Lab results and that patient will be called with positive results and encouraged patient to call if he had not heard in 2 weeks Recommended returning for continued or worsening symptoms.    - HIV/HCV [place] [facility] - Syphilis Serology, [place] [facility] - HBV Antigen/Antibody State Lab - Chlamydia/GC NAA, Confirmation  2. Exposure to trichomonas -Please treat patient as a contact to Trich.  - metroNIDAZOLE (FLAGYL) 500 MG tablet; Take 1 tablet (500 mg total) by mouth 2 (two) times daily.  Dispense: 14 tablet; Refill: 0   Total time spent: 20 minutes   Return if symptoms worsen or fail to improve.    Glenna Fellows, FNP

## 2021-11-29 LAB — CHLAMYDIA/GC NAA, CONFIRMATION
Chlamydia trachomatis, NAA: NEGATIVE
Neisseria gonorrhoeae, NAA: NEGATIVE

## 2022-07-04 ENCOUNTER — Encounter: Payer: Self-pay | Admitting: Emergency Medicine

## 2022-07-04 ENCOUNTER — Emergency Department: Payer: Medicare Other

## 2022-07-04 ENCOUNTER — Other Ambulatory Visit: Payer: Self-pay

## 2022-07-04 ENCOUNTER — Emergency Department
Admission: EM | Admit: 2022-07-04 | Discharge: 2022-07-05 | Disposition: A | Discharge status: Home / Self Care | Payer: Medicare Other | Attending: Emergency Medicine | Admitting: Emergency Medicine

## 2022-07-04 DIAGNOSIS — I10 Essential (primary) hypertension: Secondary | ICD-10-CM | POA: Diagnosis not present

## 2022-07-04 DIAGNOSIS — R102 Pelvic and perineal pain: Secondary | ICD-10-CM | POA: Insufficient documentation

## 2022-07-04 DIAGNOSIS — M48061 Spinal stenosis, lumbar region without neurogenic claudication: Secondary | ICD-10-CM | POA: Insufficient documentation

## 2022-07-04 DIAGNOSIS — I2510 Atherosclerotic heart disease of native coronary artery without angina pectoris: Secondary | ICD-10-CM | POA: Insufficient documentation

## 2022-07-04 DIAGNOSIS — R109 Unspecified abdominal pain: Secondary | ICD-10-CM

## 2022-07-04 DIAGNOSIS — E119 Type 2 diabetes mellitus without complications: Secondary | ICD-10-CM | POA: Insufficient documentation

## 2022-07-04 LAB — CBC
HCT: 50.9 % (ref 39.0–52.0)
Hemoglobin: 18.3 g/dL — ABNORMAL HIGH (ref 13.0–17.0)
MCH: 32.7 pg (ref 26.0–34.0)
MCHC: 36 g/dL (ref 30.0–36.0)
MCV: 91.1 fL (ref 80.0–100.0)
Platelets: 235 10*3/uL (ref 150–400)
RBC: 5.59 MIL/uL (ref 4.22–5.81)
RDW: 12.6 % (ref 11.5–15.5)
WBC: 5.5 10*3/uL (ref 4.0–10.5)
nRBC: 0 % (ref 0.0–0.2)

## 2022-07-04 LAB — HEPATIC FUNCTION PANEL
ALT: 57 U/L — ABNORMAL HIGH (ref 0–44)
AST: 57 U/L — ABNORMAL HIGH (ref 15–41)
Albumin: 4.4 g/dL (ref 3.5–5.0)
Alkaline Phosphatase: 99 U/L (ref 38–126)
Bilirubin, Direct: 0.2 mg/dL (ref 0.0–0.2)
Indirect Bilirubin: 0.8 mg/dL (ref 0.3–0.9)
Total Bilirubin: 1 mg/dL (ref 0.3–1.2)
Total Protein: 9.4 g/dL — ABNORMAL HIGH (ref 6.5–8.1)

## 2022-07-04 LAB — BASIC METABOLIC PANEL
Anion gap: 12 (ref 5–15)
BUN: 13 mg/dL (ref 8–23)
CO2: 25 mmol/L (ref 22–32)
Calcium: 9.9 mg/dL (ref 8.9–10.3)
Chloride: 97 mmol/L — ABNORMAL LOW (ref 98–111)
Creatinine, Ser: 0.9 mg/dL (ref 0.61–1.24)
GFR, Estimated: >60 mL/min (ref >60)
Glucose, Bld: 113 mg/dL — ABNORMAL HIGH (ref 70–99)
Potassium: 3.8 mmol/L (ref 3.5–5.1)
Sodium: 134 mmol/L — ABNORMAL LOW (ref 135–145)

## 2022-07-04 LAB — URINALYSIS, ROUTINE W REFLEX MICROSCOPIC
Bilirubin Urine: NEGATIVE
Glucose, UA: NEGATIVE mg/dL
Hgb urine dipstick: NEGATIVE
Ketones, ur: NEGATIVE mg/dL
Leukocytes,Ua: NEGATIVE
Nitrite: NEGATIVE
Protein, ur: NEGATIVE mg/dL
Specific Gravity, Urine: 1.011 (ref 1.005–1.030)
pH: 7 (ref 5.0–8.0)

## 2022-07-04 LAB — LIPASE, BLOOD: Lipase: 40 U/L (ref 11–51)

## 2022-07-04 MED ORDER — LISINOPRIL 20 MG PO TABS: 20.0000 mg | ORAL_TABLET | Freq: Every day | ORAL | 2 refills | Status: DC

## 2022-07-04 MED ORDER — LISINOPRIL 10 MG PO TABS
20.0000 mg | ORAL_TABLET | ORAL | Status: AC
  Administered 2022-07-05: 20 mg via ORAL
  Filled 2022-07-04: qty 2

## 2022-07-04 MED ORDER — HYDROMORPHONE HCL 1 MG/ML IJ SOLN
1.0000 mg | INTRAMUSCULAR | Status: AC
  Administered 2022-07-04: 1 mg via INTRAVENOUS
  Filled 2022-07-04: qty 1

## 2022-07-04 MED ORDER — IOHEXOL 300 MG/ML  SOLN
100.0000 mL | Freq: Once | INTRAMUSCULAR | Status: AC | PRN
  Administered 2022-07-04: 100 mL via INTRAVENOUS

## 2022-07-04 NOTE — ED Provider Notes (Signed)
[facility] Provider Note    Event Date/Time   First MD Initiated Contact with Patient 07/04/22 1630     (approximate)   History   Flank Pain (/)   HPI  Scott Yoder. is a 67 y.o. male who on review of family medical notes has a history of coronary disease diabetes high blood pressure and inguinal hernia   Patient reports starting yesterday he began experiencing pain over his right flank.  He reports no injury.  He is able to walk and the hip is not hurting.  Points that his right lateral pelvic brim, and reports the pain is right in that area.  Denies pain higher in the back or along the mid back.  No numbness or weakness.  No troubles walking or using his legs  Denies history of kidney stones.  Has never had pain like this before.  Physical Exam   Triage Vital Signs: ED Triage Vitals  Enc Vitals Group     BP 07/04/22 1409 (!) 189/109     Pulse Rate 07/04/22 1409 90     Resp 07/04/22 1409 18     Temp 07/04/22 1409 98 F (36.7 C)     Temp Source 07/04/22 1409 Oral     SpO2 07/04/22 1409 98 %     Weight --      Height --      Head Circumference --      Peak Flow --      Pain Score 07/04/22 1408 10     Pain Loc --      Pain Edu? --      Excl. in [place]? --    Blood pressure noted to be elevated but patient also noted to be in pain.  Bp ***  Most recent vital signs: Vitals:   07/04/22 1409 07/04/22 1820  BP: (!) 189/109 (!) 208/122  Pulse: 90 76  Resp: 18 18  Temp: 98 F (36.7 C) 98.5 F (36.9 C)  SpO2: 98% 98%     General: Awake, no distress.  He does however appear in some pain laying on his left side reports he cannot seem to find a position that will help his pain.  Is all located along the right side of his upper pelvis. CV:  Good peripheral perfusion.  Normal tones and rateResp:  Normal effort.  Abd:  No distention.  Soft nontender nondistended throughout.  Pain is not easily reproducible by abdominal exam but he reports  some tenderness to percussion along the right superior pelvis and flank, but no focal discomfort to percussion of the right costovertebral angle.  No hernias masses or skin lesions overlying.  No inguinal masses or lesions Other:  Strong palpable pulses in the feet bilaterally.  Demonstrates normal range of motion of the legs and hips bilaterally without pain   ED Results / Procedures / Treatments   Labs (all labs ordered are listed, but only abnormal results are displayed) Labs Reviewed  URINALYSIS, ROUTINE W REFLEX MICROSCOPIC - Abnormal; Notable for the following components:      Result Value   Color, Urine YELLOW (*)    APPearance CLEAR (*)    All other components within normal limits  BASIC METABOLIC PANEL - Abnormal; Notable for the following components:   Sodium 134 (*)    Chloride 97 (*)    Glucose, Bld 113 (*)    All other components within normal limits  CBC - Abnormal; Notable for the following components:  Hemoglobin 18.3 (*)    All other components within normal limits  HEPATIC FUNCTION PANEL - Abnormal; Notable for the following components:   Total Protein 9.4 (*)    AST 57 (*)    ALT 57 (*)    All other components within normal limits  LIPASE, BLOOD     EKG     RADIOLOGY  CT and abdomen pelvis interpreted by me for acute gross pathology, none is noted however additional findings on radiologist review  CT ABDOMEN PELVIS W CONTRAST  Result Date: 07/04/2022 CLINICAL DATA:  Left flank pain. EXAM: CT ABDOMEN AND PELVIS WITH CONTRAST TECHNIQUE: Multidetector CT imaging of the abdomen and pelvis was performed using the standard protocol following bolus administration of intravenous contrast. RADIATION DOSE REDUCTION: This exam was performed according to the departmental dose-optimization program which includes automated exposure control, adjustment of the mA and/or kV according to patient size and/or use of iterative reconstruction technique. CONTRAST:  161m  OMNIPAQUE IOHEXOL 300 MG/ML  SOLN COMPARISON:  July 13, 2014. FINDINGS: Lower chest: No acute abnormality. Hepatobiliary: No focal liver abnormality is seen. No gallstones, gallbladder wall thickening, or biliary dilatation. Pancreas: Unremarkable. No pancreatic ductal dilatation or surrounding inflammatory changes. Spleen: Normal in size without focal abnormality. Adrenals/Urinary Tract: Adrenal glands are unremarkable. Kidneys are normal, without renal calculi, focal lesion, or hydronephrosis. Bladder is unremarkable. Stomach/Bowel: The stomach appears normal. There is no evidence of bowel obstruction or inflammation. Vascular/Lymphatic: Aortic atherosclerosis. No enlarged abdominal or pelvic lymph nodes. Reproductive: Prostate is unremarkable. Other: No abdominal wall hernia or abnormality. No abdominopelvic ascites. Musculoskeletal: There is interval development of 15 mm rounded sclerotic density seen in the L4 vertebral body posteriorly near the left pedicle with lucent center. No definite fracture or dislocation is noted. IMPRESSION: 15 mm rounded sclerotic density is seen in the L4 vertebral body posteriorly near the left pedicle with lucent center. Potentially this may represent reaction secondary to Schmorl's node, but other pathology cannot be excluded. Further evaluation with MRI is recommended. No other significant abnormality seen in the abdomen or pelvis. Aortic Atherosclerosis (ICD10-I70.0). Electronically Signed   By: JMarijo ConceptionM.D.   On: 07/04/2022 18:03      PROCEDURES:  Critical Care performed: No  Procedures   MEDICATIONS ORDERED IN ED: Medications  lisinopril (ZESTRIL) tablet 20 mg (has no administration in time range)  HYDROmorphone (DILAUDID) injection 1 mg (1 mg Intravenous Given 07/04/22 1720)  iohexol (OMNIPAQUE) 300 MG/ML solution 100 mL (100 mLs Intravenous Contrast Given 07/04/22 1733)   MR LUMBAR SPINE WO CONTRAST  Result Date: 07/04/2022 CLINICAL DATA:  Abnormal  lesion at L4 EXAM: MRI LUMBAR SPINE WITHOUT CONTRAST TECHNIQUE: Multiplanar, multisequence MR imaging of the lumbar spine was performed. No intravenous contrast was administered. COMPARISON:  No prior MRI available, correlation is made with CT abdomen pelvis 07/04/2022 none FINDINGS: Segmentation: 5 lumbar type vertebral bodies. The lowest fully formed disc space is labeled L5-S1. Alignment:  Mild levocurvature.  4 mm retrolisthesis of L5 on S1. Vertebrae: No acute fracture. Decreased T1 signal with surrounding increased T2 signal at the anterior right (series 7, image 6) and posterior left aspects (series 7, image 12) of L4, which correlate with the sclerosis noted on the same-day CT; these are favored to be degenerative and represent Schmorl's node formation. No evidence of discitis. Conus medullaris and cauda equina: Conus extends to the L1-L2 level. Conus and cauda equina appear normal. Paraspinal and other soft tissues: No acute finding. Disc levels: T12-L1:  No significant disc bulge. No spinal canal stenosis or neural foraminal narrowing. L1-L2: No significant disc bulge. No spinal canal stenosis or neural foraminal narrowing. L2-L3: Minimal disc bulge. Mild facet arthropathy. No spinal canal stenosis or neural foraminal narrowing. L3-L4: Mild disc bulge. Mild facet arthropathy. Narrowing of the lateral recesses. Mild spinal canal stenosis. Moderate right and moderate to severe left neural foraminal narrowing. L4-L5: Moderate disc bulge. Mild facet arthropathy. 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 Narrowing of the lateral recesses. Moderate to severe bilateral neural foraminal narrowing. L5-S1: Retrolisthesis and moderate disc bulge. Mild facet arthropathy. Narrowing of the lateral recesses. No spinal canal stenosis. Moderate right and mild-to-moderate left neural foraminal narrowing. IMPRESSION: 1. L4-L5 moderate spinal canal stenosis and moderate to severe bilateral  neural foraminal narrowing. 2. L3-L4 mild spinal canal stenosis with moderate to severe left and moderate right neural foraminal narrowing. 3. L5-S1 moderate right and mild-to-moderate left neural foraminal narrowing. 4. Narrowing of the lateral recesses at L3-L4, L4-L5, and L5-S1 could affect the descending L4, L5, and S1 nerve roots, respectively. 5. The sclerosis noted at the posterior left aspect L4 on the same-day CT is favored to be related to Schmorl's node formation. Additional similar degenerative changes are noted at the anterior right aspect of the same vertebral body. Electronically Signed   By: Merilyn Baba M.D.   On: 07/04/2022 23:33     IMPRESSION / MDM / ASSESSMENT AND PLAN / ED COURSE  I reviewed the triage vital signs and the nursing notes.                              Differential diagnosis includes, but is not limited to,   Patient's presentation is most consistent with {EM COPA:27473}  *** {If the patient is on the monitor, remove the brackets and asterisks on the sentence below and remember to document it as a Procedure as well. Otherwise delete the sentence below:1} {**The patient is on the cardiac monitor to evaluate for evidence of arrhythmia and/or significant heart rate changes.**} {Remember to include, when applicable, any/all of the following data: independent review of imaging independent review of labs (comment specifically on pertinent positives and negatives) review of specific prior hospitalizations, PCP/specialist notes, etc. discuss meds given and prescribed document any discussion with consultants (including hospitalists) any clinical decision tools you used and why (PECARN, NEXUS, etc.) did you consider admitting the patient? document social determinants of health affecting patient's care (homelessness, inability to follow up in a timely fashion, etc) document any pre-existing conditions increasing risk on current visit (e.g. diabetes and HTN increasing  danger of high-risk chest pain/ACS) describes what meds you gave (especially parenteral) and why any other interventions?:1}   ----------------------------------------- 11:52 PM on 07/04/2022 ----------------------------------------- Discussed patient's MRI findings, recommended follow-up with neurosurgery.  No evidence of acute cauda equina or acute compromise requiring inpatient evaluation.  Suspect the intermittent flank pain may be related to spinal stenosis and/or radicular causation.  Currently asymptomatic.  Blood pressure notably elevated, patient blood pressure checked by me, systolic blood pressure [id number].  Patient reports that he has been run out of his lisinopril, has an appointment with primary on the 15th of this month to get it refilled.  I will provide him prescription and total of 70-monthsupply.  He is currently asymptomatic without chest pain  Return precautions and treatment recommendations and follow-up discussed with the patient who is agreeable with the plan.  FINAL CLINICAL IMPRESSION(S) / ED DIAGNOSES   Final diagnoses:  Right flank pain  Degenerative lumbar spinal stenosis  Uncontrolled hypertension     Rx / DC Orders   ED Discharge Orders          Ordered    lisinopril (ZESTRIL) 20 MG tablet  Daily        07/04/22 2351             Note:  This document was prepared using Dragon voice recognition software and may include unintentional dictation errors.

## 2022-07-04 NOTE — ED Triage Notes (Signed)
Patient to ED for right side flank pain. Pain started yesterday. Denies urinary symptoms or hx of kidney stones.

## 2022-07-09 ENCOUNTER — Telehealth: Payer: Self-pay | Admitting: Neurosurgery

## 2022-07-09 NOTE — Telephone Encounter (Signed)
-----   Message from Peggyann Shoals sent at 07/09/2022  9:12 AM EDT ----- Regarding: ER fu Contact: [phone] Left message to call back.  Meade Maw, MD  Peggyann Shoals With stacy or Andee Poles for flank right pain.    ----- Message ----- From: Delman Kitten, MD Sent: 07/05/2022   4:37 AM EST To: Meade Maw, MD

## 2022-07-11 NOTE — Telephone Encounter (Signed)
Left message to call back  

## 2022-07-16 NOTE — Telephone Encounter (Signed)
Patient declined follow up.

## 2022-10-05 DIAGNOSIS — Z0000 Encounter for general adult medical examination without abnormal findings: Secondary | ICD-10-CM | POA: Diagnosis not present

## 2022-10-05 DIAGNOSIS — Z1211 Encounter for screening for malignant neoplasm of colon: Secondary | ICD-10-CM | POA: Diagnosis not present

## 2022-10-05 DIAGNOSIS — Z7689 Persons encountering health services in other specified circumstances: Secondary | ICD-10-CM | POA: Diagnosis not present

## 2022-10-05 DIAGNOSIS — Z125 Encounter for screening for malignant neoplasm of prostate: Secondary | ICD-10-CM | POA: Diagnosis not present

## 2022-10-05 DIAGNOSIS — I10 Essential (primary) hypertension: Secondary | ICD-10-CM | POA: Diagnosis not present

## 2023-08-26 DIAGNOSIS — I2510 Atherosclerotic heart disease of native coronary artery without angina pectoris: Secondary | ICD-10-CM | POA: Diagnosis not present

## 2023-08-26 DIAGNOSIS — M25512 Pain in left shoulder: Secondary | ICD-10-CM | POA: Diagnosis not present

## 2023-08-26 DIAGNOSIS — E119 Type 2 diabetes mellitus without complications: Secondary | ICD-10-CM | POA: Diagnosis not present

## 2023-08-26 DIAGNOSIS — K4090 Unilateral inguinal hernia, without obstruction or gangrene, not specified as recurrent: Secondary | ICD-10-CM | POA: Diagnosis not present

## 2023-08-26 DIAGNOSIS — I10 Essential (primary) hypertension: Secondary | ICD-10-CM | POA: Diagnosis not present

## 2023-08-26 NOTE — Progress Notes (Signed)
 No chief complaint on file.   HPI  Scott Yoder. is a 68 y.o. here for an acute issue.  He has a PMH of HTN, CAD, HLD, DM2, anxiety/depression who presents with several concerns.  Out of blood pressure medication.  Also reports swelling in the left groin that happened after working in the yard.  Also reports falling yesterday injuring his left shoulder and having difficulty raising his arm.    ROS  Pertinent items are noted in HPI.  Outpatient Encounter Medications as of 08/26/2023  Medication Sig Dispense Refill  . acetaminophen  (TYLENOL ) 500 mg capsule Take 500 mg by mouth every 4 (four) hours as needed for Fever.    . amLODIPine  (NORVASC ) 5 MG tablet Take 1 tablet (5 mg total) by mouth once daily 30 tablet 11  . aspirin  81 MG EC tablet Take 1 tablet (81 mg total) by mouth once daily 90 tablet 3  . carvediloL  (COREG ) 25 MG tablet Take 1 tablet (25 mg total) by mouth 2 (two) times daily with meals 180 tablet 3  . fluticasone  (FLONASE ) 50 mcg/actuation nasal spray Place 2 sprays into both nostrils once daily. Please call [phone] opt 1 to schedule appt for further refills. 16 g 0  . lisinopriL -hydroCHLOROthiazide  (ZESTORETIC) 20-25 mg tablet Take 1 tablet by mouth once daily 30 tablet 11  . nitroGLYcerin (NITROSTAT) 0.4 MG SL tablet Place 1 tablet (0.4 mg total) under the tongue every 5 (five) minutes as needed 25 tablet 11  . traMADol (ULTRAM) 50 mg tablet tramadol 50 mg tablet  Take 1 tablet every 6 hours by oral route as needed.     No facility-administered encounter medications on file as of 08/26/2023.    Allergies as of 08/26/2023  . (No Known Allergies)    Past Medical History:  Diagnosis Date  . Anxiety   . CAD (coronary artery disease) jan 2015  . Depression   . Hyperlipidemia   . Hypertension   . Type 2 diabetes mellitus without complications (CMS/HHS-HCC) 07/31/2014    Past Surgical History:  Procedure Laterality Date  . CORONARY ARTERY BYPASS W/SINGLE  ARTERY GRAFT N/A 05/20/2013   Procedure: CORONARY ARTERY BYPASS W/SINGLE ARTERY GRAFT, Left internal mammary artery harvest;  Surgeon: Norleen Dow Exon, MD;  Location: [facility];  Service: Cardiothoracic;  Laterality: N/A;  CABG x3, mam and vein; Median Sternotomy  . CORONARY ARTERY BYPASS W/VENOUS & ARTERIAL GRAFTS N/A 05/20/2013   Procedure: CORONARY ARTERY BYPASS W/VENOUS & ARTERIAL GRAFTS;  Surgeon: Norleen Dow Exon, MD;  Location: [facility];  Service: Cardiothoracic;  Laterality: N/A;  . TRANSESOPHAGEAL ECHOCARDIOGRAPHY N/A 05/20/2013   Procedure: TRANSESOPHAGEAL ECHOCARDIOGRAPHY;  Surgeon: Norleen Dow Exon, MD;  Location: [facility];  Service: Cardiothoracic;  Laterality: N/A;  Performed by anesthesia.    Vitals:   08/26/23 1327  BP: (!) 160/100  Pulse: 81    Physical Exam  General. Well appearing; NAD; VS reviewed     HEENT: Sclera and conjunctiva clear; EOMI,  Lungs. Respirations unlabored; clear to auscultation bilaterally. Cardiovascular. Heart regular rate and rhythm without murmurs, gallops, or rubs. Left groin: Tennis ball size mass, reducible when lying down. Extremities:  No edema. Skin. Normal color and turgor Neurologic. Alert and oriented x3   Assessment and Plan   1. Hypertension, unspecified type Refilled medications.  Schedule labs prior to PCP physical in June. -     lisinopriL -hydroCHLOROthiazide  (ZESTORETIC) 20-25 mg tablet; Take 1 tablet by mouth once daily -  amLODIPine  (NORVASC ) 5 MG tablet; Take 1 tablet (5 mg total) by mouth once daily -     carvediloL  (COREG ) 25 MG tablet; Take 1 tablet (25 mg total) by mouth 2 (two) times daily with meals -     CBC w/auto Differential (5 Part); Future -     Hemoglobin A1C; Future -     Lipid Panel w/calc LDL; Future -     Urinalysis w/Microscopic; Future -     Thyroid Stimulating-Hormone (TSH); Future -     Microalbumin/Creatinine Ratio, Random Urine; Future  2. Type 2 diabetes  mellitus without complication, unspecified whether long term insulin use (CMS/HHS-HCC)  -     CBC w/auto Differential (5 Part); Future -     Hemoglobin A1C; Future -     Lipid Panel w/calc LDL; Future -     Urinalysis w/Microscopic; Future -     Thyroid Stimulating-Hormone (TSH); Future -     Microalbumin/Creatinine Ratio, Random Urine; Future  3. Coronary artery disease involving native coronary artery of native heart without angina pectoris  -     carvediloL  (COREG ) 25 MG tablet; Take 1 tablet (25 mg total) by mouth 2 (two) times daily with meals -     CBC w/auto Differential (5 Part); Future -     Hemoglobin A1C; Future -     Lipid Panel w/calc LDL; Future -     Urinalysis w/Microscopic; Future -     Thyroid Stimulating-Hormone (TSH); Future -     Microalbumin/Creatinine Ratio, Random Urine; Future  4. Left inguinal hernia  -     Ambulatory Referral to General Surgery  5. Acute pain of left shoulder With positive impingement, supraspinatus test.  X-rays today. -     Ambulatory Referral to Orthopedic Surgery -     X-ray shoulder complete left minimum 2 views    I have personally performed this service.  [address] [place], [place]

## 2023-12-05 DIAGNOSIS — K047 Periapical abscess without sinus: Secondary | ICD-10-CM | POA: Diagnosis not present

## 2023-12-09 ENCOUNTER — Other Ambulatory Visit: Payer: Self-pay

## 2023-12-09 ENCOUNTER — Emergency Department
Admission: EM | Admit: 2023-12-09 | Discharge: 2023-12-09 | Discharge status: Against Medical Advice | Payer: Self-pay | Source: Home / Self Care

## 2023-12-09 DIAGNOSIS — K0889 Other specified disorders of teeth and supporting structures: Secondary | ICD-10-CM | POA: Insufficient documentation

## 2023-12-09 DIAGNOSIS — R531 Weakness: Secondary | ICD-10-CM | POA: Insufficient documentation

## 2023-12-09 DIAGNOSIS — Z5321 Procedure and treatment not carried out due to patient leaving prior to being seen by health care provider: Secondary | ICD-10-CM | POA: Insufficient documentation

## 2023-12-09 DIAGNOSIS — I63521 Cerebral infarction due to unspecified occlusion or stenosis of right anterior cerebral artery: Secondary | ICD-10-CM | POA: Diagnosis not present

## 2023-12-09 NOTE — ED Triage Notes (Signed)
 Pt comes in via pov with complaints of dental pain that started Friday. Pt states that she was not able to eat much this weekend due to the pain. Pt also states that he had weakness in his legs yesterday, and is unsure why he was feeling that way. Pt complains of dental pain 8/10, and is scheduled to see the dentist in the morning.

## 2023-12-09 NOTE — ED Notes (Signed)
No answer when called several times from lobby; no answer when phone # listed in chart called 

## 2023-12-10 ENCOUNTER — Emergency Department
Admission: EM | Admit: 2023-12-10 | Discharge: 2023-12-10 | Discharge status: Against Medical Advice | Payer: Self-pay | Source: Home / Self Care

## 2023-12-10 DIAGNOSIS — K0889 Other specified disorders of teeth and supporting structures: Secondary | ICD-10-CM | POA: Insufficient documentation

## 2023-12-10 DIAGNOSIS — I10 Essential (primary) hypertension: Secondary | ICD-10-CM | POA: Insufficient documentation

## 2023-12-10 DIAGNOSIS — Z5321 Procedure and treatment not carried out due to patient leaving prior to being seen by health care provider: Secondary | ICD-10-CM | POA: Insufficient documentation

## 2023-12-10 LAB — COMPREHENSIVE METABOLIC PANEL WITH GFR
ALT: 41 U/L (ref 0–44)
AST: 40 U/L (ref 15–41)
Albumin: 3.8 g/dL (ref 3.5–5.0)
Alkaline Phosphatase: 75 U/L (ref 38–126)
Anion gap: 12 (ref 5–15)
BUN: 14 mg/dL (ref 8–23)
CO2: 26 mmol/L (ref 22–32)
Calcium: 9.4 mg/dL (ref 8.9–10.3)
Chloride: 102 mmol/L (ref 98–111)
Creatinine, Ser: 0.85 mg/dL (ref 0.61–1.24)
GFR, Estimated: >60 mL/min (ref >60)
Glucose, Bld: 101 mg/dL — ABNORMAL HIGH (ref 70–99)
Potassium: 3.5 mmol/L (ref 3.5–5.1)
Sodium: 140 mmol/L (ref 135–145)
Total Bilirubin: 1.1 mg/dL (ref 0.0–1.2)
Total Protein: 8 g/dL (ref 6.5–8.1)

## 2023-12-10 LAB — CBC
HCT: 45.2 % (ref 39.0–52.0)
Hemoglobin: 16.1 g/dL (ref 13.0–17.0)
MCH: 32.5 pg (ref 26.0–34.0)
MCHC: 35.6 g/dL (ref 30.0–36.0)
MCV: 91.3 fL (ref 80.0–100.0)
Platelets: 256 K/uL (ref 150–400)
RBC: 4.95 MIL/uL (ref 4.22–5.81)
RDW: 13.1 % (ref 11.5–15.5)
WBC: 4.7 K/uL (ref 4.0–10.5)
nRBC: 0 % (ref 0.0–0.2)

## 2023-12-10 NOTE — ED Triage Notes (Signed)
 Pt presents to the ED via POV from home for weakness in his legs. Pt was seen here yesterday for same. Pt reports dental pain causing him not to be able to eat. Pt was seen at dentist this morning and states that they couldn't do anything because of his BP being too high. Pt states that he has a hx of htn and did not take his BP medication yesterday or today. BP 207/130 at time of triage.

## 2023-12-11 ENCOUNTER — Emergency Department: Payer: Self-pay

## 2023-12-11 ENCOUNTER — Inpatient Hospital Stay: Payer: Self-pay

## 2023-12-11 ENCOUNTER — Inpatient Hospital Stay
Admission: EM | Admit: 2023-12-11 | Discharge: 2023-12-18 | DRG: 065 | Disposition: A | Discharge status: Home Health | Payer: Self-pay | Attending: Osteopathic Medicine | Admitting: Osteopathic Medicine

## 2023-12-11 ENCOUNTER — Other Ambulatory Visit: Payer: Self-pay

## 2023-12-11 DIAGNOSIS — E7800 Pure hypercholesterolemia, unspecified: Secondary | ICD-10-CM | POA: Diagnosis not present

## 2023-12-11 DIAGNOSIS — Z8673 Personal history of transient ischemic attack (TIA), and cerebral infarction without residual deficits: Secondary | ICD-10-CM | POA: Diagnosis not present

## 2023-12-11 DIAGNOSIS — Z7902 Long term (current) use of antithrombotics/antiplatelets: Secondary | ICD-10-CM | POA: Diagnosis not present

## 2023-12-11 DIAGNOSIS — Z7982 Long term (current) use of aspirin: Secondary | ICD-10-CM

## 2023-12-11 DIAGNOSIS — I10 Essential (primary) hypertension: Secondary | ICD-10-CM | POA: Diagnosis not present

## 2023-12-11 DIAGNOSIS — Z951 Presence of aortocoronary bypass graft: Secondary | ICD-10-CM

## 2023-12-11 DIAGNOSIS — Z823 Family history of stroke: Secondary | ICD-10-CM

## 2023-12-11 DIAGNOSIS — I2510 Atherosclerotic heart disease of native coronary artery without angina pectoris: Secondary | ICD-10-CM | POA: Diagnosis not present

## 2023-12-11 DIAGNOSIS — G9389 Other specified disorders of brain: Secondary | ICD-10-CM | POA: Diagnosis present

## 2023-12-11 DIAGNOSIS — E871 Hypo-osmolality and hyponatremia: Secondary | ICD-10-CM | POA: Diagnosis not present

## 2023-12-11 DIAGNOSIS — Z8249 Family history of ischemic heart disease and other diseases of the circulatory system: Secondary | ICD-10-CM | POA: Diagnosis not present

## 2023-12-11 DIAGNOSIS — R471 Dysarthria and anarthria: Secondary | ICD-10-CM | POA: Diagnosis not present

## 2023-12-11 DIAGNOSIS — I161 Hypertensive emergency: Secondary | ICD-10-CM | POA: Diagnosis present

## 2023-12-11 DIAGNOSIS — K0889 Other specified disorders of teeth and supporting structures: Secondary | ICD-10-CM | POA: Diagnosis not present

## 2023-12-11 DIAGNOSIS — E876 Hypokalemia: Secondary | ICD-10-CM | POA: Diagnosis not present

## 2023-12-11 DIAGNOSIS — R7401 Elevation of levels of liver transaminase levels: Secondary | ICD-10-CM | POA: Diagnosis not present

## 2023-12-11 DIAGNOSIS — R531 Weakness: Secondary | ICD-10-CM | POA: Diagnosis not present

## 2023-12-11 DIAGNOSIS — F1410 Cocaine abuse, uncomplicated: Secondary | ICD-10-CM | POA: Diagnosis not present

## 2023-12-11 DIAGNOSIS — R410 Disorientation, unspecified: Secondary | ICD-10-CM | POA: Diagnosis present

## 2023-12-11 DIAGNOSIS — R29704 NIHSS score 4: Secondary | ICD-10-CM | POA: Diagnosis present

## 2023-12-11 DIAGNOSIS — Z833 Family history of diabetes mellitus: Secondary | ICD-10-CM | POA: Diagnosis not present

## 2023-12-11 DIAGNOSIS — R29898 Other symptoms and signs involving the musculoskeletal system: Principal | ICD-10-CM

## 2023-12-11 DIAGNOSIS — G8314 Monoplegia of lower limb affecting left nondominant side: Secondary | ICD-10-CM | POA: Diagnosis present

## 2023-12-11 DIAGNOSIS — F1210 Cannabis abuse, uncomplicated: Secondary | ICD-10-CM | POA: Diagnosis present

## 2023-12-11 DIAGNOSIS — K824 Cholesterolosis of gallbladder: Secondary | ICD-10-CM | POA: Diagnosis not present

## 2023-12-11 DIAGNOSIS — G459 Transient cerebral ischemic attack, unspecified: Secondary | ICD-10-CM | POA: Diagnosis present

## 2023-12-11 DIAGNOSIS — R636 Underweight: Secondary | ICD-10-CM | POA: Diagnosis present

## 2023-12-11 DIAGNOSIS — Z681 Body mass index (BMI) 19 or less, adult: Secondary | ICD-10-CM

## 2023-12-11 DIAGNOSIS — I779 Disorder of arteries and arterioles, unspecified: Secondary | ICD-10-CM | POA: Diagnosis not present

## 2023-12-11 DIAGNOSIS — I63521 Cerebral infarction due to unspecified occlusion or stenosis of right anterior cerebral artery: Secondary | ICD-10-CM | POA: Diagnosis not present

## 2023-12-11 DIAGNOSIS — M6281 Muscle weakness (generalized): Secondary | ICD-10-CM | POA: Diagnosis not present

## 2023-12-11 DIAGNOSIS — Z79899 Other long term (current) drug therapy: Secondary | ICD-10-CM

## 2023-12-11 DIAGNOSIS — E119 Type 2 diabetes mellitus without complications: Secondary | ICD-10-CM | POA: Diagnosis present

## 2023-12-11 LAB — COMPREHENSIVE METABOLIC PANEL WITH GFR
ALT: 48 U/L — ABNORMAL HIGH (ref 0–44)
AST: 50 U/L — ABNORMAL HIGH (ref 15–41)
Albumin: 4.4 g/dL (ref 3.5–5.0)
Alkaline Phosphatase: 77 U/L (ref 38–126)
Anion gap: 11 (ref 5–15)
BUN: 18 mg/dL (ref 8–23)
CO2: 28 mmol/L (ref 22–32)
Calcium: 9.7 mg/dL (ref 8.9–10.3)
Chloride: 99 mmol/L (ref 98–111)
Creatinine, Ser: 0.85 mg/dL (ref 0.61–1.24)
GFR, Estimated: >60 mL/min (ref >60)
Glucose, Bld: 107 mg/dL — ABNORMAL HIGH (ref 70–99)
Potassium: 3.8 mmol/L (ref 3.5–5.1)
Sodium: 138 mmol/L (ref 135–145)
Total Bilirubin: 1.6 mg/dL — ABNORMAL HIGH (ref 0.0–1.2)
Total Protein: 9.2 g/dL — ABNORMAL HIGH (ref 6.5–8.1)

## 2023-12-11 LAB — URINE DRUG SCREEN, QUALITATIVE (ARMC ONLY)
Amphetamines, Ur Screen: NOT DETECTED
Barbiturates, Ur Screen: NOT DETECTED
Benzodiazepine, Ur Scrn: NOT DETECTED
Cannabinoid 50 Ng, Ur ~~LOC~~: POSITIVE — AB
Cocaine Metabolite,Ur ~~LOC~~: POSITIVE — AB
MDMA (Ecstasy)Ur Screen: NOT DETECTED
Methadone Scn, Ur: NOT DETECTED
Opiate, Ur Screen: NOT DETECTED
Phencyclidine (PCP) Ur S: NOT DETECTED
Tricyclic, Ur Screen: NOT DETECTED

## 2023-12-11 LAB — URINALYSIS, W/ REFLEX TO CULTURE (INFECTION SUSPECTED)
Bacteria, UA: NONE SEEN
Bilirubin Urine: NEGATIVE
Glucose, UA: NEGATIVE mg/dL
Hgb urine dipstick: NEGATIVE
Ketones, ur: NEGATIVE mg/dL
Leukocytes,Ua: NEGATIVE
Nitrite: NEGATIVE
Protein, ur: NEGATIVE mg/dL
Specific Gravity, Urine: 1.014 (ref 1.005–1.030)
Squamous Epithelial / HPF: 0 /HPF (ref 0–5)
pH: 7 (ref 5.0–8.0)

## 2023-12-11 LAB — CBC
HCT: 48.7 % (ref 39.0–52.0)
Hemoglobin: 17.5 g/dL — ABNORMAL HIGH (ref 13.0–17.0)
MCH: 32.3 pg (ref 26.0–34.0)
MCHC: 35.9 g/dL (ref 30.0–36.0)
MCV: 89.9 fL (ref 80.0–100.0)
Platelets: 276 K/uL (ref 150–400)
RBC: 5.42 MIL/uL (ref 4.22–5.81)
RDW: 13.2 % (ref 11.5–15.5)
WBC: 4.8 K/uL (ref 4.0–10.5)
nRBC: 0 % (ref 0.0–0.2)

## 2023-12-11 LAB — GLUCOSE, CAPILLARY: Glucose-Capillary: 88 mg/dL (ref 70–99)

## 2023-12-11 LAB — MRSA NEXT GEN BY PCR, NASAL: MRSA by PCR Next Gen: NOT DETECTED

## 2023-12-11 LAB — TROPONIN I (HIGH SENSITIVITY): Troponin I (High Sensitivity): 8 ng/L (ref <18)

## 2023-12-11 MED ORDER — ACETAMINOPHEN 325 MG PO TABS
650.0000 mg | ORAL_TABLET | ORAL | Status: DC | PRN
  Administered 2023-12-14: 650 mg via ORAL
  Filled 2023-12-11: qty 2

## 2023-12-11 MED ORDER — CHLORHEXIDINE GLUCONATE CLOTH 2 % EX PADS
6.0000 | MEDICATED_PAD | Freq: Every day | CUTANEOUS | Status: DC
  Administered 2023-12-12 – 2023-12-14 (×3): 6 via TOPICAL

## 2023-12-11 MED ORDER — SENNOSIDES-DOCUSATE SODIUM 8.6-50 MG PO TABS
1.0000 | ORAL_TABLET | Freq: Every evening | ORAL | Status: DC | PRN
  Administered 2023-12-15: 1 via ORAL
  Filled 2023-12-11: qty 1

## 2023-12-11 MED ORDER — ACETAMINOPHEN 650 MG RE SUPP: 650.0000 mg | RECTAL | Status: DC | PRN

## 2023-12-11 MED ORDER — LABETALOL HCL 5 MG/ML IV SOLN
20.0000 mg | INTRAVENOUS | Status: DC | PRN
  Administered 2023-12-12 – 2023-12-13 (×2): 20 mg via INTRAVENOUS
  Filled 2023-12-11 (×3): qty 4

## 2023-12-11 MED ORDER — ORAL CARE MOUTH RINSE: 15.0000 mL | OROMUCOSAL | Status: DC | PRN

## 2023-12-11 MED ORDER — CARVEDILOL 25 MG PO TABS
25.0000 mg | ORAL_TABLET | Freq: Once | ORAL | Status: AC
  Administered 2023-12-11 (×2): 25 mg via ORAL
  Filled 2023-12-11: qty 1

## 2023-12-11 MED ORDER — ASPIRIN 81 MG PO CHEW
81.0000 mg | CHEWABLE_TABLET | Freq: Every day | ORAL | Status: DC
  Administered 2023-12-12 – 2023-12-18 (×7): 81 mg via ORAL
  Filled 2023-12-11 (×7): qty 1

## 2023-12-11 MED ORDER — IOHEXOL 350 MG/ML SOLN
75.0000 mL | Freq: Once | INTRAVENOUS | Status: AC | PRN
  Administered 2023-12-11 (×2): 75 mL via INTRAVENOUS

## 2023-12-11 MED ORDER — SODIUM CHLORIDE 0.9 % IV SOLN: INTRAVENOUS | Status: AC

## 2023-12-11 MED ORDER — ACETAMINOPHEN 160 MG/5ML PO SOLN: 650.0000 mg | ORAL | Status: DC | PRN

## 2023-12-11 MED ORDER — STROKE: EARLY STAGES OF RECOVERY BOOK: Freq: Once | Status: AC

## 2023-12-11 MED ORDER — AMLODIPINE BESYLATE 5 MG PO TABS
5.0000 mg | ORAL_TABLET | Freq: Once | ORAL | Status: AC
  Administered 2023-12-11 (×2): 5 mg via ORAL
  Filled 2023-12-11: qty 1

## 2023-12-11 MED ORDER — ASPIRIN 300 MG RE SUPP
300.0000 mg | Freq: Every day | RECTAL | Status: DC
  Administered 2023-12-11 (×2): 300 mg via RECTAL
  Filled 2023-12-11 (×3): qty 1

## 2023-12-11 MED ORDER — HYDROCHLOROTHIAZIDE 25 MG PO TABS
25.0000 mg | ORAL_TABLET | Freq: Once | ORAL | Status: AC
  Administered 2023-12-11 (×2): 25 mg via ORAL
  Filled 2023-12-11: qty 1

## 2023-12-11 MED ORDER — ASPIRIN 325 MG PO TABS
325.0000 mg | ORAL_TABLET | Freq: Every day | ORAL | Status: DC
  Administered 2023-12-12: 325 mg via ORAL
  Filled 2023-12-11: qty 1

## 2023-12-11 MED ORDER — HEPARIN SODIUM (PORCINE) 5000 UNIT/ML IJ SOLN: 5000.0000 [IU] | Freq: Three times a day (TID) | INTRAMUSCULAR | Status: DC

## 2023-12-11 NOTE — Group Note (Deleted)
 Date:  12/11/2023 Time:  2:22 PM  Group Topic/Focus:  Wellness Toolbox:   The focus of this group is to discuss various aspects of wellness, balancing those aspects and exploring ways to increase the ability to experience wellness.  Patients will create a wellness toolbox for use upon discharge.     Participation Level:  {BHH PARTICIPATION OZCZO:77735}  Participation Quality:  {BHH PARTICIPATION QUALITY:22265}  Affect:  {BHH AFFECT:22266}  Cognitive:  {BHH COGNITIVE:22267}  Insight: {BHH Insight2:20797}  Engagement in Group:  {BHH ENGAGEMENT IN HMNLE:77731}  Modes of Intervention:  {BHH MODES OF INTERVENTION:22269}  Additional Comments:  ***  Myra Curtistine BROCKS 12/11/2023, 2:22 PM

## 2023-12-11 NOTE — ED Triage Notes (Signed)
 C/O weakness in his legs.  Presented the past two days with similar.  AAOx3. Skin warm and dry. NAD

## 2023-12-11 NOTE — ED Notes (Signed)
 pA: TIA vs CVA

## 2023-12-11 NOTE — ED Notes (Signed)
 Attempted to ambulate patient but patient with unsteady gait and hesitation on walking. Also complains of his voice being different and I sounding more hoarse.

## 2023-12-11 NOTE — ED Provider Notes (Signed)
 [facility] Provider Note    Event Date/Time   First MD Initiated Contact with Patient 12/11/23 1248     (approximate)   History   Extremity Weakness   HPI  Scott Yoder. is a 68 year old male with history of diabetes presenting to the emergency department for evaluation of weakness.  Initially reported weakness in his legs in triage, tells me that he feels generally weak.  However, on reevaluation he later tells me that he thinks that is just his left leg that is weak.  He thinks this started on Sunday.  Reports he is having some dental pain and was not eating well because of it.  He went to the dentist but reports he was told his blood pressure was too high.  Does feel like his dental pain is currently improved.  Per his last PCP note from April, he is on lisinopril , amlodipine , Coreg .  Reports he has been taking these.  Denies headaches, chest pain, shortness of breath, low back pain, bowel or bladder symptoms, trauma.     Physical Exam   Triage Vital Signs: ED Triage Vitals  Encounter Vitals Group     BP 12/11/23 0923 (!) 206/114     Girls Systolic BP Percentile --      Girls Diastolic BP Percentile --      Boys Systolic BP Percentile --      Boys Diastolic BP Percentile --      Pulse Rate 12/11/23 0923 72     Resp 12/11/23 0923 16     Temp 12/11/23 0923 97.9 F (36.6 C)     Temp Source 12/11/23 0923 Oral     SpO2 12/11/23 0923 100 %     Weight 12/11/23 0923 175 lb 0.7 oz (79.4 kg)     Height 12/11/23 1243 5' (1.524 m)     Head Circumference --      Peak Flow --      Pain Score 12/11/23 0923 0     Pain Loc --      Pain Education --      Exclude from Growth Chart --     Most recent vital signs: Vitals:   12/11/23 1400 12/11/23 1500  BP: (!) 191/119 (!) 210/127  Pulse: 71 72  Resp: 17 18  Temp:    SpO2: 100% 100%     General: Awake, interactive  CV:  Regular rate, good peripheral perfusion.  Resp:  Unlabored respirations.   Abd:  Nondistended.  Neuro:  Keenly aware, correctly answers month and age, able to blink eyes and squeeze hands, normal horizontal extraocular movements, no visual field loss, normal facial symmetry, drift of left leg, doesn't hit bed, no drift remainder of extremities, but left arm and leg are weaker than contralateral side, no limb ataxia, normal sensation, no aphasia, mild dysarthria the patient reports he has not noticed any changes in his voice, no inattention NIH 2     ED Results / Procedures / Treatments   Labs (all labs ordered are listed, but only abnormal results are displayed) Labs Reviewed  CBC - Abnormal; Notable for the following components:      Result Value   Hemoglobin 17.5 (*)    All other components within normal limits  COMPREHENSIVE METABOLIC PANEL WITH GFR - Abnormal; Notable for the following components:   Glucose, Bld 107 (*)    Total Protein 9.2 (*)    AST 50 (*)    ALT 48 (*)  Total Bilirubin 1.6 (*)    All other components within normal limits  URINALYSIS, W/ REFLEX TO CULTURE (INFECTION SUSPECTED)  TROPONIN I (HIGH SENSITIVITY)     EKG EKG independently reviewed and interpreted by myself demonstrates:  EKG demonstrates normal sinus rhythm rate of 64, PR 158, cures 90, QTc 445, nonspecific ST changes  RADIOLOGY Imaging independently reviewed and interpreted by myself demonstrates:    Formal Radiology Read:  No results found.  PROCEDURES:  Critical Care performed: No  Procedures   MEDICATIONS ORDERED IN ED: Medications - No data to display   IMPRESSION / MDM / ASSESSMENT AND PLAN / ED COURSE  I reviewed the triage vital signs and the nursing notes.  Differential diagnosis includes, but is not limited to, anemia, electrolyte abnormality, arrhythmia, no cardiopulmonary or intra-abdominal symptoms, back pain, or evidence of spinal cord pathology, no headaches or focal deficits suggestive of stroke, intracranial bleed, other primary  neurologic process  Patient's presentation is most consistent with acute presentation with potential threat to life or bodily function.  68 year old male presenting with weakness.  History somewhat inconsistent, but it sounds like on Sunday, patient did have an acute change in his strength and now is reporting left leg weakness, potentially with associated left arm weakness as well.  Outside window for any stroke intervention.  Labs with CBC, CMP without critical derangements.  Mild transaminitis noted, but no abdominal pain suggestive of acute hepatobiliary pathology.  Will obtain CT head to further evaluate.  Signed out to oncoming physician pending head CT.  Even if unremarkable, I remain concerned about subacute stroke with patient's clinical history.  Suspect he may be appropriate for admission for further evaluation.      FINAL CLINICAL IMPRESSION(S) / ED DIAGNOSES   Final diagnoses:  Left leg weakness  Left arm weakness     Rx / DC Orders   ED Discharge Orders     None        Note:  This document was prepared using Dragon voice recognition software and may include unintentional dictation errors.   Levander Slate, MD 12/11/23 [phone]

## 2023-12-11 NOTE — ED Notes (Signed)
 CBC creatinine already resulted for heparin  protocol

## 2023-12-11 NOTE — ED Notes (Signed)
 Admitting MD at bedside.

## 2023-12-11 NOTE — H&P (Signed)
 History and Physical    Scott Yoder. FMW:[id number] DOB: 1956/03/13 DOA: 12/11/2023  PCP: [facility]  Patient coming from: home  I have personally briefly reviewed patient's old medical records in [facility]  Chief Complaint: lower extremity weakness left >right, confusion   HPI: Scott Kermitt Harjo. is a 68 y.o. male with medical history significant of CAD s/p CABG 2015, HTN, HLD , per patient not on statin as was taken of medication, who presents to ED for the third time since Sunday due to lower extremity weakness Left >right as well as mild confusion. Per patient he woke up on Sunday with these symptoms. He notes symptoms are not improving. He notes no new vision changes, no upper extremity weakness,  no chest pain, sob/ fever/chills n/v/d or abdominal pain . Per RN patient has had intermittent episode of slurred speech in ED.  ED Course:  Temp 99, bp 207/130, hr 71, rr 18 sat 100%  Wbc 4.7, hg 16.1, plt 256 Na 140, K 3.5, Cl 102,  glu 101, cr 0.85  EKG: EKG poor baseline, but noted twave inversion lateral leads AST 50, Alt 48,  t-bili 1.6 CE8 Uaneg CTH IMPRESSION: 1. No acute intracranial abnormality. 2. Multifocal remote infarcts. 3. 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   Review of Systems: As per HPI otherwise 10 point review of systems negative.   Past Medical History:  Diagnosis Date   Coronary artery disease    Hernia, inguinal    High cholesterol    Hypertension    Hypokalemia     Past Surgical History:  Procedure Laterality Date   CORONARY ARTERY BYPASS GRAFT     TRACHEOSTOMY       reports that he has never smoked. He has never used smokeless tobacco. He reports current alcohol use. He reports current drug use. Drug: Marijuana.  No Known Allergies  Family History  Problem Relation Age of Onset   Diabetes Mother    Hypertension Mother    Stroke Mother     Prior to Admission medications   Medication Sig Start Date  End Date Taking? Authorizing Provider  aspirin  81 MG tablet Take 81 mg by mouth daily.    [provider]  carvedilol  (COREG ) 25 MG tablet Take 2 tablets by mouth 2 (two) times daily.    [provider]  cyclobenzaprine (FLEXERIL) 10 MG tablet Take 10 mg by mouth 3 (three) times daily as needed.    [provider]  DULoxetine (CYMBALTA) 60 MG capsule Take 60 mg by mouth daily. Patient not taking: Reported on 11/24/2021    [provider]  fluticasone  (FLONASE ) 50 MCG/ACT nasal spray Place 2 sprays into both nostrils daily. 08/26/17   Fisher, Devere ORN, PA-C  hydrochlorothiazide  (HYDRODIURIL ) 25 MG tablet Take 25 mg by mouth daily.    [provider]  hydrocortisone  2.5 % ointment Apply topically 2 (two) times daily. 09/24/18   Triplett, Cari B, FNP  lisinopril  (ZESTRIL ) 20 MG tablet Take 1 tablet (20 mg total) by mouth daily. 07/04/22 10/02/22  Dicky Anes, MD  metroNIDAZOLE  (FLAGYL ) 500 MG tablet Take 1 tablet (500 mg total) by mouth 2 (two) times daily. 11/24/21   White, Ayo, NP  nitroGLYCERIN (NITROSTAT) 0.4 MG SL tablet Place 0.4 mg under the tongue 3 (three) times daily as needed. 1 tab every five minutes x 3, as needed x chest pain    [provider]  simvastatin (ZOCOR) 20 MG tablet Take 20 mg  by mouth at bedtime. Patient not taking: Reported on 11/24/2021    [provider]    Physical Exam: Vitals:   12/11/23 1600 12/11/23 1630 12/11/23 1730 12/11/23 1830  BP: (!) 205/135 (!) 222/124 (!) 211/142 (!) 187/109  Pulse: 72 74 81 79  Resp: (!) 27 20 18  (!) 22  Temp:      TempSrc:      SpO2: 100% 100% 99% 100%  Weight:      Height:        Constitutional: NAD, calm, comfortable Vitals:   12/11/23 1600 12/11/23 1630 12/11/23 1730 12/11/23 1830  BP: (!) 205/135 (!) 222/124 (!) 211/142 (!) 187/109  Pulse: 72 74 81 79  Resp: (!) 27 20 18  (!) 22  Temp:      TempSrc:      SpO2: 100% 100% 99% 100%  Weight:      Height:       Eyes:  pupils equal, lids and conjunctivae normal ENMT: Mucous membranes are moist. Posterior pharynx clear of any exudate or lesions. Neck: normal, supple, no masses, no thyromegaly Respiratory: clear to auscultation bilaterally, no wheezing, no crackles. Normal respiratory effort. No accessory muscle use.  Cardiovascular: Regular rate and rhythm, no murmurs / rubs / gallops. No extremity edema. 2+ pedal pulses.  Abdomen: no tenderness, no masses palpated. No hepatosplenomegaly. Bowel sounds positive.  Musculoskeletal: no clubbing / cyanosis. No joint deformity upper and lower extremities. Good ROM, no contractures. Normal muscle tone.  Skin: no rashes, lesions, ulcers. No induration Neurologic: CN  grossly intact. Sensation intact, Strength 5/5 in all 4. patient does appear to not be able to hold leg up on left as on right but strength is 5/5 . Holding left leg up for 5 seconds notes drift but does not touch bed.  Psychiatric: Normal judgment and insight. Alert and oriented x 3. Normal mood.    Labs on Admission: I have personally reviewed following labs and imaging studies  CBC: Recent Labs  Lab 12/10/23 1625 12/11/23 1151  WBC 4.7 4.8  HGB 16.1 17.5*  HCT 45.2 48.7  MCV 91.3 89.9  PLT 256 276   Basic Metabolic Panel: Recent Labs  Lab 12/10/23 1625 12/11/23 1151  NA 140 138  K 3.5 3.8  CL 102 99  CO2 26 28  GLUCOSE 101* 107*  BUN 14 18  CREATININE 0.85 0.85  CALCIUM  9.4 9.7   GFR: Estimated Creatinine Clearance: 74.7 mL/min (by C-G formula based on SCr of 0.85 mg/dL). Liver Function Tests: Recent Labs  Lab 12/10/23 1625 12/11/23 1151  AST 40 50*  ALT 41 48*  ALKPHOS 75 77  BILITOT 1.1 1.6*  PROT 8.0 9.2*  ALBUMIN 3.8 4.4   No results for input(s): LIPASE, AMYLASE in the last 168 hours. No results for input(s): AMMONIA in the last 168 hours. Coagulation Profile: No results for input(s): INR, PROTIME in the last 168 hours. Cardiac Enzymes: No results for  input(s): CKTOTAL, CKMB, CKMBINDEX, TROPONINI in the last 168 hours. BNP (last 3 results) No results for input(s): PROBNP in the last 8760 hours. HbA1C: No results for input(s): HGBA1C in the last 72 hours. CBG: No results for input(s): GLUCAP in the last 168 hours. Lipid Profile: No results for input(s): CHOL, HDL, LDLCALC, TRIG, CHOLHDL, LDLDIRECT in the last 72 hours. Thyroid Function Tests: No results for input(s): TSH, T4TOTAL, FREET4, T3FREE, THYROIDAB in the last 72 hours. Anemia Panel: No results for input(s): VITAMINB12, FOLATE, FERRITIN, TIBC, IRON, RETICCTPCT in the  last 72 hours. Urine analysis:    Component Value Date/Time   COLORURINE YELLOW (A) 12/11/2023 1347   APPEARANCEUR CLEAR (A) 12/11/2023 1347   APPEARANCEUR Clear 03/29/2012 1641   LABSPEC 1.014 12/11/2023 1347   LABSPEC 1.010 03/29/2012 1641   PHURINE 7.0 12/11/2023 1347   GLUCOSEU NEGATIVE 12/11/2023 1347   GLUCOSEU Negative 03/29/2012 1641   HGBUR NEGATIVE 12/11/2023 1347   BILIRUBINUR NEGATIVE 12/11/2023 1347   BILIRUBINUR Negative 03/29/2012 1641   KETONESUR NEGATIVE 12/11/2023 1347   PROTEINUR NEGATIVE 12/11/2023 1347   NITRITE NEGATIVE 12/11/2023 1347   LEUKOCYTESUR NEGATIVE 12/11/2023 1347   LEUKOCYTESUR Negative 03/29/2012 1641    Radiological Exams on Admission: CT Head Wo Contrast Result Date: 12/11/2023 CLINICAL DATA:  Neuro deficit, acute, stroke suspected Weakness in legs. EXAM: CT HEAD WITHOUT CONTRAST TECHNIQUE: Contiguous axial images were obtained from the base of the skull through the vertex without intravenous contrast. RADIATION DOSE REDUCTION: This exam was performed according to the departmental dose-optimization program which includes automated exposure control, adjustment of the mA and/or kV according to patient size and/or use of iterative reconstruction technique. COMPARISON:  Remote head CT 08/21/2013 FINDINGS: Brain: No acute  intracranial hemorrhage. There is no evidence of acute ischemia. Moderate area of encephalomalacia in the right temporal lobe. Small area of encephalomalacia in the left cerebellum. There are remote bilateral lacunar infarcts in the basal ganglia and caudate. Brain volume is normal for age. Mild to moderate periventricular and deep white matter hypodensity typical of chronic small vessel ischemia. No subdural or extra-axial collection. No evidence of mass lesion/mass effect or midline shift. 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 Skull: Normal. Negative for fracture or focal lesion. Sinuses/Orbits: Paranasal sinuses and mastoid air cells are clear. The visualized orbits are unremarkable. Other: None. IMPRESSION: 1. No acute intracranial abnormality. 2. Multifocal remote infarcts. 3. 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 Electronically Signed   By: Andrea Gasman M.D.   On: 12/11/2023 16:23    EKG: Independently reviewed.   Assessment/Plan TIA r/o CVA -slurred speech/ confusion intermittent ,leg weakness - CT head no acute findings but noted old infarcts no prior dx of CVA in the past that patient remembers, neuro exam no mild drift on left -admit tia/cva r/o  -mri pending , CTA pending -asa daily  -neuro checks , SLP, PT/OT  -echo /carotids  per protocol  -await further neuro recs   Hypertensive Emergency  - admit to progressive care  -permissive hypertension in setting of concern for CVA - prn labetalol  per stroke parameters 220/120  HLD -not on statin currently  -note to have mild elevated lfts  - will check lipid panel  - hold statin for now   Mild Elevated LFTs - liver u/s   CAD s/p CABG -resume carvedilol  /ace in 24-48 hours  -continue asa  DVT prophylaxis: scd Code Status: full/ as discussed per patient wishes in event of cardiac arrest  Family Communication:   Scott Yoder, Scott Yoder  (Sister) [phone] (Home Phone)   Disposition Plan: patient  expected to be admitted greater than 2 midnights  Consults called: Dr Michaela Neurology Admission status: Step down   Scott DELENA Ned MD [facility]   If 7PM-7AM, please contact night-coverage [email or web address] Password [facility]  12/11/2023, 7:23 PM

## 2023-12-11 NOTE — ED Notes (Signed)
 MRI called to report in route to take pt to MRI and then ultrasound.

## 2023-12-11 NOTE — Progress Notes (Signed)
 Pt arrived to the unit via wheelchair. Transferred from wheelchair to hospital bed x2 assist. Upon arrival to the unit, pt noted to be A&O x4 and on room air. Pt placed on cardiac monitors, CHG bath completed, and placed in hospital gown. NIH completed. Secure message sent to Erminio Cone, NP to confirm orders for NIH stroke screen - will complete NIH Q2hr x12 (until [phone]) and then Q4 per initial order for NIH. Advised NP that aspirin  that was ordered while in ED was not given and confirmed that it was still to be administered. Aspirin  (rectal) administered per orders due to NPO status. Pt oriented to the room/unit and POC. Bed in lowest position with bed alarms on and call bell within reach.

## 2023-12-12 DIAGNOSIS — G459 Transient cerebral ischemic attack, unspecified: Secondary | ICD-10-CM | POA: Diagnosis not present

## 2023-12-12 LAB — LIPID PANEL
Cholesterol: 106 mg/dL (ref 0–200)
HDL: 29 mg/dL — ABNORMAL LOW (ref >40)
LDL Cholesterol: 66 mg/dL (ref 0–99)
Total CHOL/HDL Ratio: 3.7 ratio
Triglycerides: 57 mg/dL (ref <150)
VLDL: 11 mg/dL (ref 0–40)

## 2023-12-12 LAB — HIV ANTIBODY (ROUTINE TESTING W REFLEX): HIV Screen 4th Generation wRfx: NONREACTIVE

## 2023-12-12 MED ORDER — OXYCODONE HCL 5 MG PO TABS
10.0000 mg | ORAL_TABLET | Freq: Four times a day (QID) | ORAL | Status: DC | PRN
  Administered 2023-12-12 – 2023-12-17 (×9): 10 mg via ORAL
  Filled 2023-12-12 (×9): qty 2

## 2023-12-12 NOTE — Evaluation (Addendum)
 Occupational Therapy Evaluation Patient Details Name: Scott Yoder. MRN: [id number] DOB: [birth year] Today's Date: 12/12/2023   History of Present Illness   68 y/o male presented to ED on 12/11/23 for lower extremity weakness, mild confusion, and elevated BP. MRI showed acute R ACA infarct. PMH: HTN, CAD s/p CABG 2015, HLD     Clinical Impressions Chart reviewed to date, pt greeted in bed, perseverating regarding calling family members. He is providing incorrect phone numbers for family members when double checked with his phone. He is alert and oriented x4, however and appears anxious throughout. PTA pt reports he is indep in ADL/IADL. Pt presents with deficits  in strength, endurance, activity tolerance, balance, cognition, affecting safe and optimal ADL completion. Pt performs supine<>sit with CGA-MIN A  however BP sitting on edge of bed is 175/127 and diastolic BP is above permissive HTN. Nurse notified and to give PRN meds. Further sessions will target progressing mobility but anticipate pt would benefit from post acute OT to address deficits and to facilitate return to indep PLOF. OT will follow.      If plan is discharge home, recommend the following:   A little help with walking and/or transfers;A little help with bathing/dressing/bathroom     Functional Status Assessment   Patient has had a recent decline in their functional status and demonstrates the ability to make significant improvements in function in a reasonable and predictable amount of time.     Equipment Recommendations   BSC/3in1     Recommendations for Other Services         Precautions/Restrictions   Precautions Precautions: Fall Recall of Precautions/Restrictions: Intact Precaution/Restrictions Comments: watch BP Restrictions Weight Bearing Restrictions Per Provider Order: No     Mobility Bed Mobility Overal bed mobility: Needs Assistance Bed Mobility: Supine to Sit, Sit to Supine      Supine to sit: Contact guard (initially +2)- Sit to supine: Contact guard assist-MIN A (for LLE management)         Transfers                   General transfer comment: deferred due to BP      Balance Overall balance assessment: Needs assistance Sitting-balance support: No upper extremity supported, Feet supported Sitting balance-Leahy Scale: Good                                     ADL either performed or assessed with clinical judgement   ADL Overall ADL's : Needs assistance/impaired     Grooming: Set up;Sitting               Lower Body Dressing: Minimal assistance Lower Body Dressing Details (indicate cue type and reason): anticipate                     Vision Patient Visual Report: No change from baseline Additional Comments: will continue to assess     Perception         Praxis         Pertinent Vitals/Pain Pain Assessment Pain Assessment: No/denies pain     Extremity/Trunk Assessment Upper Extremity Assessment Upper Extremity Assessment: Generalized weakness   Lower Extremity Assessment Lower Extremity Assessment: Defer to PT evaluation   Cervical / Trunk Assessment Cervical / Trunk Assessment: Normal   Communication Communication Communication: No apparent difficulties   Cognition Arousal: Alert Behavior During Therapy: [facility] for tasks  assessed/performed, Anxious Cognition: Cognition impaired       Memory impairment (select all impairments): Short-term memory, Working [employer] fast streamer, Conservation officer, historic buildings Attention impairment (select first level of impairment): Sustained attention Executive functioning impairment (select all impairments): Organization, Reasoning, Problem solving                   Following commands: Intact       Cueing  General Comments   Cueing Techniques: Verbal cues;Gestural cues;Tactile cues;Visual cues  BP 175/127 sitting on edge of bed therefore further attempts  at mobility deferred   Exercises Other Exercises Other Exercises: edu re role of OT, role of rehab, discharge recommendations, safe ADL completion   Shoulder Instructions      Home Living Family/patient expects to be discharged to:: Private residence Living Arrangements: Alone   Type of Home: House Home Access: Stairs to enter [employer] of Steps: 4-5 Entrance Stairs-Rails: Right;Left;Can reach both Home Layout: One level               Home Equipment: None          Prior Functioning/Environment Prior Level of Function : Independent/Modified Independent;Driving                    OT Problem List: Decreased strength;Impaired balance (sitting and/or standing);Decreased cognition;Decreased safety awareness;Decreased activity tolerance;Decreased knowledge of use of DME or AE   OT Treatment/Interventions: Self-care/ADL training;Therapeutic exercise;Patient/family education;Balance training;Energy conservation;Therapeutic activities;DME and/or AE instruction;Neuromuscular education      OT Goals(Current goals can be found in the care plan section)   Acute Rehab OT Goals Patient Stated Goal: call my daughter OT Goal Formulation: With patient Time For Goal Achievement: 12/26/23 Potential to Achieve Goals: Good ADL Goals Pt Will Perform Grooming: with modified independence;sitting;standing Pt Will Perform Lower Body Dressing: with modified independence;sitting/lateral leans;sit to/from stand Pt Will Transfer to Toilet: with modified independence;ambulating Pt Will Perform Toileting - Clothing Manipulation and hygiene: with modified independence;sit to/from stand;sitting/lateral leans   OT Frequency:  Min 3X/week    Co-evaluation PT/OT/SLP Co-Evaluation/Treatment: Yes Reason for Co-Treatment: Necessary to address cognition/behavior during functional activity;For patient/therapist safety   OT goals addressed during session: ADL's and self-care       AM-PAC OT 6 Clicks Daily Activity     Outcome Measure Help from another person eating meals?: None Help from another person taking care of personal grooming?: None Help from another person toileting, which includes using toliet, bedpan, or urinal?: A Little Help from another person bathing (including washing, rinsing, drying)?: A Little Help from another person to put on and taking off regular upper body clothing?: None Help from another person to put on and taking off regular lower body clothing?: A Little 6 Click Score: 21   End of Session Nurse Communication: Mobility status (vitals)  Activity Tolerance: Treatment limited secondary to medical complications (Comment) Patient left: in bed;with call bell/phone within reach;with bed alarm set  OT Visit Diagnosis: Other abnormalities of gait and mobility (R26.89);Muscle weakness (generalized) (M62.81)                Time: 8971-8952 OT Time Calculation (min): 19 min Charges:  OT General Charges $OT Visit: 1 Visit OT Evaluation $OT Eval Moderate Complexity: 1 Mod Therisa Sheffield, OTD OTR/L  12/12/23, 1:18 PM

## 2023-12-12 NOTE — Evaluation (Addendum)
 Speech Language Pathology Evaluation Patient Details Name: Scott Yoder. MRN: [id number] DOB: 1955-11-21 Today's Date: 12/12/2023 Time: 8659-8644 SLP Time Calculation (min) (ACUTE ONLY): 15 min  Problem List:  Patient Active Problem List   Diagnosis Date Noted   TIA (transient ischemic attack) 12/11/2023   Atherosclerosis of coronary artery 07/31/2014   Diabetes (HCC) 07/31/2014   BP (high blood pressure) 07/31/2014   Coronary artery disease    Hernia, inguinal 07/14/2014   Past Medical History:  Past Medical History:  Diagnosis Date   Coronary artery disease    Hernia, inguinal    High cholesterol    Hypertension    Hypokalemia    Past Surgical History:  Past Surgical History:  Procedure Laterality Date   CORONARY ARTERY BYPASS GRAFT     TRACHEOSTOMY     HPI:  68 y/o male presented to ED on 12/11/23 for lower extremity weakness, mild confusion, and elevated BP. PMH: HTN, CAD s/p CABG 2015, HLD, alcoholism. MRi revealed Acute right ACA distribution infarct. No associated hemorrhage or  mass effect.  2. Underlying moderately advanced chronic microvascular ischemic  disease with a few scattered remote lacunar infarcts involving the  hemispheric cerebral white matter, deep gray nuclei, and left  cerebellum.  3. Encephalomalacia and gliosis involving the anterior right  temporal lobe, which could be related to prior trauma and/or  ischemia.   Assessment / Plan / Recommendation Clinical Impression  Pt reports that he lives by himself, unable to confirm cognitive communication baseline at this time.   Pt's verbal and physical perseveration as well as severe restlessness prohibited completion of a formal cognitive assessment. Informally, pt severely restless throughout constantly re-arranging his blankets (despite SLP assistance), constantly touching and moving his gown. In addition he presents with significant deficits in selective attention, fair retrieval of information  (recalled 2 of 3 words), working memory and executive functions. Questionable left side neglect - pt was leaning to his left throughout this evaluation and he held his fork in his left hand throughout (pt's meal/bedside table was not in front of him but he continued to hold the fork even when arranging his blankets) - pt didn't appear to perceive the fork.   Pt would benefit from further assessment for safe discharge planning.   Secure chat sent to pt's medical team regarding restlessness (jitter-like behaviors). Question if behaviors might be related to cocaine, cannabis and alcohol use at admission.     SLP Assessment  SLP Recommendation/Assessment: Patient needs continued Speech Language Pathology Services SLP Visit Diagnosis: Cognitive communication deficit (R41.841);Frontal lobe and executive function deficit;Attention and concentration deficit Attention and concentration deficit following: Other cerebrovascular disease Frontal lobe and executive function deficit following: Other cerebrovascular disease     Assistance Recommended at Discharge  Frequent or constant Supervision/Assistance  Functional Status Assessment Patient has had a recent decline in their functional status and demonstrates the ability to make significant improvements in function in a reasonable and predictable amount of time.  Frequency and Duration min 2x/week  2 weeks      SLP Evaluation Cognition  Overall Cognitive Status: No family/caregiver present to determine baseline cognitive functioning Arousal/Alertness: Awake/alert Orientation Level: Oriented X4 Attention: Selective Selective Attention: Impaired Selective Attention Impairment: Verbal basic;Functional basic Memory: Impaired Memory Impairment: Retrieval deficit;Storage deficit Awareness: Appears intact Problem Solving: Impaired Problem Solving Impairment: Verbal complex;Functional complex Executive Function: Reasoning;Organizing;Decision  Making Behaviors: Restless;Perseveration       Comprehension  Auditory Comprehension Overall Auditory Comprehension: Appears within functional limits for tasks  assessed    Expression Expression Primary Mode of Expression: Verbal Verbal Expression Overall Verbal Expression: Appears within functional limits for tasks assessed   Oral / Motor  Oral Motor/Sensory Function Overall Oral Motor/Sensory Function: Within functional limits Motor Speech Overall Motor Speech: Appears within functional limits for tasks assessed           Jaeline Whobrey B. Rubbie, M.S., CCC-SLP, Tree surgeon Certified Brain Injury Specialist [facility]  [facility] Rehabilitation Services Office [phone] Ascom [phone] Fax [phone]

## 2023-12-12 NOTE — Evaluation (Signed)
 Physical Therapy Evaluation Patient Details Name: Scott Yoder. MRN: [id number] DOB: [birth year] Today's Date: 12/12/2023  History of Present Illness  68 y/o male presented to ED on 12/11/23 for lower extremity weakness, mild confusion, and elevated BP. MRI showed acute R ACA infarct. PMH: HTN, CAD s/p CABG 2015, HLD  Clinical Impression  Patient admitted with the above. PTA, patient lives alone and was independent with no AD. Evaluation limited by elevated DBP >120. Able to complete bed mobility with CGA-minA and able to sit EOB with supervision. Patient anxious throughout session requiring frequent cues for redirect. Patient fixated on wanting to call daughter and unable to recall correct phone number, although found number in his phone to call. Unable to assess OOB mobility this date and will complete further assessment once BP is managed. Patient will benefit from skilled PT services during acute stay to address listed deficits. Patient will benefit from ongoing therapy at discharge to maximize functional independence and safety.         Equipment Recommendations Other (comment) (TBD)  Recommendations for Other Services  Rehab consult    Functional Status Assessment Patient has had a recent decline in their functional status and demonstrates the ability to make significant improvements in function in a reasonable and predictable amount of time.     Precautions / Restrictions Precautions Precautions: Fall Recall of Precautions/Restrictions: Intact Precaution/Restrictions Comments: watch BP Restrictions Weight Bearing Restrictions Per Provider Order: No      Mobility  Bed Mobility Overal bed mobility: Needs Assistance Bed Mobility: Supine to Sit, Sit to Supine     Supine to sit: Contact guard Sit to supine: Contact guard assist        Transfers                   General transfer comment: deferred due to DBP >120    Ambulation/Gait                   Stairs            Wheelchair Mobility     Tilt Bed    Modified Rankin (Stroke Patients Only)       Balance Overall balance assessment: Needs assistance Sitting-balance support: No upper extremity supported, Feet supported Sitting balance-Leahy Scale: Good                                       Pertinent Vitals/Pain Pain Assessment Pain Assessment: No/denies pain    Home Living Family/patient expects to be discharged to:: Private residence Living Arrangements: Alone   Type of Home: House Home Access: Stairs to enter Entrance Stairs-Rails: Right;Left;Can reach both Secretary/administrator of Steps: 4-5   Home Layout: One level Home Equipment: None      Prior Function Prior Level of Function : Independent/Modified Independent;Driving                     Extremity/Trunk Assessment   Upper Extremity Assessment Upper Extremity Assessment: Defer to OT evaluation    Lower Extremity Assessment Lower Extremity Assessment: Generalized weakness    Cervical / Trunk Assessment Cervical / Trunk Assessment: Normal  Communication   Communication Communication: No apparent difficulties    Cognition Arousal: Alert Behavior During Therapy: WFL for tasks assessed/performed   PT - Cognitive impairments: Attention, Problem solving, Safety/Judgement, Awareness  Following commands: Intact       Cueing       General Comments      Exercises     Assessment/Plan    PT Assessment Patient needs continued PT services  PT Problem List Decreased strength;Decreased activity tolerance;Decreased balance;Decreased mobility;Decreased safety awareness;Decreased knowledge of use of DME;Decreased knowledge of precautions;Cardiopulmonary status limiting activity       PT Treatment Interventions DME instruction;Gait training;Functional mobility training;Therapeutic activities;Therapeutic exercise;Stair  training;Balance training;Neuromuscular re-education;Patient/family education    PT Goals (Current goals can be found in the Care Plan section)  Acute Rehab PT Goals Patient Stated Goal: to figure things out PT Goal Formulation: With patient Time For Goal Achievement: 12/26/23 Potential to Achieve Goals: Good    Frequency Min 3X/week     Co-evaluation               AM-PAC PT 6 Clicks Mobility  Outcome Measure Help needed turning from your back to your side while in a flat bed without using bedrails?: A Little Help needed moving from lying on your back to sitting on the side of a flat bed without using bedrails?: A Little Help needed moving to and from a bed to a chair (including a wheelchair)?: A Lot Help needed standing up from a chair using your arms (e.g., wheelchair or bedside chair)?: A Lot Help needed to walk in hospital room?: A Lot Help needed climbing 3-5 steps with a railing? : A Lot 6 Click Score: 14    End of Session   Activity Tolerance: Treatment limited secondary to medical complications (Comment) (elevated DBP >120) Patient left: in bed;with call bell/phone within reach;with bed alarm set Nurse Communication: Other (comment) (elevated DBP) PT Visit Diagnosis: Unsteadiness on feet (R26.81);Muscle weakness (generalized) (M62.81);Other abnormalities of gait and mobility (R26.89)    Time: 8971-8953 PT Time Calculation (min) (ACUTE ONLY): 18 min   Charges:   PT Evaluation $PT Eval Moderate Complexity: 1 Mod   PT General Charges $$ ACUTE PT VISIT: 1 Visit         Maryanne Finder, PT, DPT Physical Therapist - [facility]  [facility]   Marylynn Rigdon A Delsin Copen 12/12/2023, 1:05 PM

## 2023-12-12 NOTE — Progress Notes (Signed)
 Progress Note   Patient: Scott Yoder. FMW:[id number] DOB: [birth year] DOA: 12/11/2023     1 DOS: the patient was seen and examined on 12/12/2023   Brief hospital course: From HPI Jahleel Bright Spielmann. is a 68 y.o. male with medical history significant of CAD s/p CABG 2015, HTN, HLD , per patient not on statin as was taken of medication, who presents to ED for the third time since Sunday due to lower extremity weakness Left >right as well as mild confusion. Per patient he woke up on Sunday with these symptoms. He notes symptoms are not improving. He notes no new vision changes, no upper extremity weakness,  no chest pain, sob/ fever/chills n/v/d or abdominal pain . Per RN patient has had intermittent episode of slurred speech in ED.     Assessment and Plan: Acute ischemic stroke -slurred speech/ confusion intermittent ,leg weakness - CT head no acute findings but noted old infarcts no prior dx of CVA in the past that patient remembers, neuro exam no mild drift on left Neurologist on board and case discussed Continue aspirin   MRI of the brain showed acute right ICA infarct Continue PT OT    Hypertensive Emergency  - Continue blood pressure monitoring - Allow 48 hours of permissive hypertension in setting of concern for CVA - prn labetalol  per stroke parameters 220/120    Multiple drug abuse Patient's U tox positive for cannabis and cocaine Counseled on cessation We will monitor closely for withdrawals  HLD -not on statin currently  -note to have mild elevated lfts  Lipid panel shows LDL 66 - hold statin for now    Mild Elevated LFTs - liver u/s    CAD s/p CABG We will allow permissive hypertension for 48 hours -continue asa   DVT prophylaxis: scd Code Status: full/ as discussed per patient wishes in event of cardiac arrest    Disposition Plan: Pending medical stabilization   Consults called: Neurology  Subjective:  Patient seen and examined at bedside this  morning He admits to improvement in his general condition Denies nausea vomiting chest pain  Physical Exam: Eyes: pupils equal, lids and conjunctivae normal ENMT: Mucous membranes are moist. Posterior pharynx clear of any exudate or lesions. Neck: normal, supple, no masses, no thyromegaly Respiratory: clear to auscultation bilaterally, no wheezing, no crackles. Normal respiratory effort. No accessory muscle use.  Cardiovascular: Regular rate and rhythm, no murmurs / rubs / gallops. No extremity edema. 2+ pedal pulses.  Abdomen: no tenderness, no masses palpated. No hepatosplenomegaly. Bowel sounds positive.  Musculoskeletal: no clubbing / cyanosis. No joint deformity upper and lower extremities. Good ROM, no contractures. Normal muscle tone.  Skin: no rashes, lesions, ulcers. No induration Neurologic: CN  grossly intact. Sensation intact, Strength 5/5 in all 4. patient does appear to not be able to hold leg up on left as on right but strength is 5/5 . Holding left leg up for 5 seconds notes drift but does not touch bed.  Psychiatric: Normal judgment and insight. Alert and oriented x 3. Normal mood.     Vitals:   12/12/23 1300 12/12/23 1311 12/12/23 1400 12/12/23 1500  BP: (!) 185/129 (!) 195/127  (!) 184/112  Pulse: 77  77 79  Resp: (!) 24  20 (!) 23  Temp:      TempSrc:      SpO2: 98%  96% 98%  Weight:      Height:        Data Reviewed:  MRI reviewed with  findings as noted above    Latest Ref Rng & Units 12/11/2023   11:51 AM 12/10/2023    4:25 PM 07/04/2022    2:11 PM  CBC  WBC 4.0 - 10.5 K/uL 4.8  4.7  5.5   Hemoglobin 13.0 - 17.0 g/dL 82.4  83.8  81.6   Hematocrit 39.0 - 52.0 % 48.7  45.2  50.9   Platelets 150 - 400 K/uL 276  256  235        Latest Ref Rng & Units 12/11/2023   11:51 AM 12/10/2023    4:25 PM 07/04/2022    2:11 PM  BMP  Glucose 70 - 99 mg/dL 892  898  886   BUN 8 - 23 mg/dL 18  14  13    Creatinine 0.61 - 1.24 mg/dL 9.14  9.14  9.09   Sodium 135 - 145  mmol/L 138  140  134   Potassium 3.5 - 5.1 mmol/L 3.8  3.5  3.8   Chloride 98 - 111 mmol/L 99  102  97   CO2 22 - 32 mmol/L 28  26  25    Calcium  8.9 - 10.3 mg/dL 9.7  9.4  9.9      Time spent: 58 minutes  Author: Drue ONEIDA Potter, MD 12/12/2023 4:58 PM  For on call review [email or web address].

## 2023-12-12 NOTE — Plan of Care (Signed)
  Problem: Education: Goal: Knowledge of disease or condition will improve Outcome: Progressing Goal: Knowledge of secondary prevention will improve (MUST DOCUMENT ALL) Outcome: Progressing Goal: Knowledge of patient specific risk factors will improve (DELETE if not current risk factor) Outcome: Progressing   Problem: Ischemic Stroke/TIA Tissue Perfusion: Goal: Complications of ischemic stroke/TIA will be minimized 12/12/2023 0114 by Cleaster Clotilda HERO, RN Outcome: Progressing 12/12/2023 0111 by Cleaster Clotilda HERO, RN Outcome: Progressing   Problem: Clinical Measurements: Goal: Ability to maintain clinical measurements within normal limits will improve Outcome: Progressing Goal: Respiratory complications will improve Outcome: Progressing Goal: Cardiovascular complication will be avoided Outcome: Progressing   Problem: Elimination: Goal: Will not experience complications related to bowel motility Outcome: Progressing Goal: Will not experience complications related to urinary retention Outcome: Progressing   Problem: Pain Managment: Goal: General experience of comfort will improve and/or be controlled Outcome: Progressing

## 2023-12-12 NOTE — Progress Notes (Signed)
 Inpatient Rehab Admissions Coordinator Note:  Per therapy recommendations patient was screened for CIR candidacy by Reche FORBES Lowers, PT. At this time, note evals limited by BP but CGA to EOB.  We will not pursue a rehab consult at this time but we will rescreen after next therapy session.  Please contact me with questions.    Reche Lowers, PT, DPT [phone] 12/12/23  3:42 PM

## 2023-12-13 ENCOUNTER — Inpatient Hospital Stay
Admit: 2023-12-13 | Discharge: 2023-12-13 | Disposition: A | Discharge status: Home / Self Care | Attending: Internal Medicine | Admitting: Internal Medicine

## 2023-12-13 DIAGNOSIS — I779 Disorder of arteries and arterioles, unspecified: Secondary | ICD-10-CM | POA: Diagnosis not present

## 2023-12-13 DIAGNOSIS — I63521 Cerebral infarction due to unspecified occlusion or stenosis of right anterior cerebral artery: Secondary | ICD-10-CM

## 2023-12-13 DIAGNOSIS — G459 Transient cerebral ischemic attack, unspecified: Secondary | ICD-10-CM | POA: Diagnosis not present

## 2023-12-13 DIAGNOSIS — R29704 NIHSS score 4: Secondary | ICD-10-CM

## 2023-12-13 LAB — CBC WITH DIFFERENTIAL/PLATELET
Abs Immature Granulocytes: 0.01 K/uL (ref 0.00–0.07)
Basophils Absolute: 0 K/uL (ref 0.0–0.1)
Basophils Relative: 1 %
Eosinophils Absolute: 0.1 K/uL (ref 0.0–0.5)
Eosinophils Relative: 2 %
HCT: 41.5 % (ref 39.0–52.0)
Hemoglobin: 15.1 g/dL (ref 13.0–17.0)
Immature Granulocytes: 0 %
Lymphocytes Relative: 45 %
Lymphs Abs: 2.4 K/uL (ref 0.7–4.0)
MCH: 32.5 pg (ref 26.0–34.0)
MCHC: 36.4 g/dL — ABNORMAL HIGH (ref 30.0–36.0)
MCV: 89.2 fL (ref 80.0–100.0)
Monocytes Absolute: 0.6 K/uL (ref 0.1–1.0)
Monocytes Relative: 12 %
Neutro Abs: 2.1 K/uL (ref 1.7–7.7)
Neutrophils Relative %: 40 %
Platelets: 265 K/uL (ref 150–400)
RBC: 4.65 MIL/uL (ref 4.22–5.81)
RDW: 13.1 % (ref 11.5–15.5)
WBC: 5.3 K/uL (ref 4.0–10.5)
nRBC: 0 % (ref 0.0–0.2)

## 2023-12-13 LAB — BASIC METABOLIC PANEL WITH GFR
Anion gap: 9 (ref 5–15)
BUN: 28 mg/dL — ABNORMAL HIGH (ref 8–23)
CO2: 26 mmol/L (ref 22–32)
Calcium: 8.8 mg/dL — ABNORMAL LOW (ref 8.9–10.3)
Chloride: 101 mmol/L (ref 98–111)
Creatinine, Ser: 0.88 mg/dL (ref 0.61–1.24)
GFR, Estimated: >60 mL/min (ref >60)
Glucose, Bld: 107 mg/dL — ABNORMAL HIGH (ref 70–99)
Potassium: 3 mmol/L — ABNORMAL LOW (ref 3.5–5.1)
Sodium: 136 mmol/L (ref 135–145)

## 2023-12-13 LAB — HEMOGLOBIN A1C
Hgb A1c MFr Bld: 5.3 % (ref 4.8–5.6)
Mean Plasma Glucose: 105 mg/dL

## 2023-12-13 LAB — ECHOCARDIOGRAM COMPLETE
AR max vel: 3.07 cm2
AV Peak grad: 3.9 mmHg
Ao pk vel: 0.99 m/s
Area-P 1/2: 3.27 cm2
Height: 70 in
S' Lateral: 3 cm
Weight: 1971.79 [oz_av]

## 2023-12-13 MED ORDER — CLOPIDOGREL BISULFATE 75 MG PO TABS
75.0000 mg | ORAL_TABLET | Freq: Every day | ORAL | Status: DC
  Administered 2023-12-13 – 2023-12-18 (×6): 75 mg via ORAL
  Filled 2023-12-13 (×6): qty 1

## 2023-12-13 MED ORDER — HYDROCHLOROTHIAZIDE 25 MG PO TABS
25.0000 mg | ORAL_TABLET | Freq: Every day | ORAL | Status: DC
  Administered 2023-12-13 – 2023-12-17 (×5): 25 mg via ORAL
  Filled 2023-12-13 (×5): qty 1

## 2023-12-13 MED ORDER — POTASSIUM CHLORIDE CRYS ER 20 MEQ PO TBCR
40.0000 meq | EXTENDED_RELEASE_TABLET | ORAL | Status: AC
  Administered 2023-12-13 (×2): 40 meq via ORAL
  Filled 2023-12-13 (×2): qty 2

## 2023-12-13 MED ORDER — AMLODIPINE BESYLATE 5 MG PO TABS
5.0000 mg | ORAL_TABLET | Freq: Every day | ORAL | Status: DC
  Administered 2023-12-13 – 2023-12-14 (×2): 5 mg via ORAL
  Filled 2023-12-13 (×2): qty 1

## 2023-12-13 MED ORDER — HYDRALAZINE HCL 20 MG/ML IJ SOLN
5.0000 mg | Freq: Once | INTRAMUSCULAR | Status: AC
  Administered 2023-12-13: 5 mg via INTRAVENOUS
  Filled 2023-12-13: qty 1

## 2023-12-13 NOTE — Consult Note (Signed)
 NEUROLOGY CONSULT NOTE   Date of service: December 13, 2023 Patient Name: Scott Yoder. MRN:  [id number] DOB:  07/20/1955 Chief Complaint: L>R weakness Requesting Provider: Dorinda Drue DASEN, MD  History of Present Illness  Scott Yoder. is a 68 y.o. male with hx of CAD status post CABG 2015, hypertension, hyperlipidemia who presented to the ED for the third time since Sunday due to lower extremity weakness left greater than right as well as mild confusion.  Last known well was Sunday.  He Coming back to the ED because his symptoms were not improving.  He has no other focal deficits.  He has had intermittent episodes of slurred speech since arrival.  MRI brain showed acute right ACA infarct with other scattered remote lacunar infarcts as well as encephalomalacia and gliosis involving the anterior right temporal lobe.  CTA showed multifocal moderate to severe stenosis in the head and neck.  TTE showed no intracardiac clot.  LKW: Sunday Modified rankin score: 2-Slight disability-UNABLE to perform all activities but does not need assistance  NIHSS components Score: Comment  1a Level of Conscious 0[x] 1[] 2[] 3[]     1b LOC Questions 0[x] 1[] 2[]      1c LOC Commands 0[x] 1[] 2[]      2 Best Gaze 0[x] 1[] 2[]      3 Visual 0[x] 1[] 2[] 3[]     4 Facial Palsy 0[x] 1[] 2[] 3[]     5a Motor Arm - left 0[x] 1[] 2[] 3[] 4[] UN[]   5b Motor Arm - Right 0[x] 1[] 2[] 3[] 4[] UN[]   6a Motor Leg - Left 0[] 1[] 2[x] 3[] 4[] UN[]   6b Motor Leg - Right 0[] 1[x] 2[] 3[] 4[] UN[]   7 Limb Ataxia 0[x] 1[] 2[] UN[]     8 Sensory 0[x] 1[] 2[] UN[]     9 Best Language 0[x] 1[] 2[] 3[]     10 Dysarthria 0[] 1[x] 2[] UN[]     11  Extinct. and Inattention 0[x]  1[]  2[]       TOTAL:  4      ROS   Comprehensive ROS performed and pertinent positives documented in HPI   Past History   Past Medical History:  Diagnosis Date   Coronary artery disease    Hernia, inguinal    High cholesterol     Hypertension    Hypokalemia     Past Surgical History:  Procedure Laterality Date   CORONARY ARTERY BYPASS GRAFT     TRACHEOSTOMY      Family History: Family History  Problem Relation Age of Onset   Diabetes Mother    Hypertension Mother    Stroke Mother     Social History  reports that he has never smoked. He has never used smokeless tobacco. He reports current alcohol use. He reports current drug use. Drug: Marijuana.  No Known Allergies  Medications   Current Facility-Administered Medications:    acetaminophen  (TYLENOL ) tablet 650 mg, 650 mg, Oral, Q4H PRN **OR** acetaminophen  (TYLENOL ) 160 MG/5ML solution 650 mg, 650 mg, Per Tube, Q4H PRN **OR** acetaminophen  (TYLENOL ) suppository 650 mg, 650 mg, Rectal, Q4H PRN, Debby Camila LABOR, MD   aspirin  chewable tablet 81 mg, 81 mg, Oral, Daily, Debby Camila A, MD, 81 mg at 12/13/23 9077   Chlorhexidine  Gluconate Cloth 2 % PADS 6 each, 6 each, Topical, Daily, Debby Camila LABOR, MD, 6 each at 12/13/23 9077   labetalol  (NORMODYNE ) injection 20 mg, 20  mg, Intravenous, Q2H PRN, Debby Hitch A, MD, 20 mg at 12/12/23 1054   Oral care mouth rinse, 15 mL, Mouth Rinse, PRN, Debby Hitch LABOR, MD   oxyCODONE  (Oxy IR/ROXICODONE ) immediate release tablet 10 mg, 10 mg, Oral, Q6H PRN, Dorinda Drue DASEN, MD, 10 mg at 12/13/23 0031   potassium chloride  SA (KLOR-CON  M) CR tablet 40 mEq, 40 mEq, Oral, Q4H, Djan, Prince T, MD, 40 mEq at 12/13/23 0958   senna-docusate (Senokot-S) tablet 1 tablet, 1 tablet, Oral, QHS PRN, Debby Hitch LABOR, MD  Vitals   Vitals:   12/13/23 0900 12/13/23 1000 12/13/23 1100 12/13/23 1200  BP: (!) 174/108 (!) 199/118 (!) 202/120 (!) 199/121  Pulse: (!) 57 60 66 65  Resp: 14 17 16 16   Temp:      TempSrc:      SpO2: 95% 95% 96% 97%  Weight:      Height:        Body mass index is 17.68 kg/m.   Physical Exam    Gen: patient lying in bed, NAD CV: extremities appear well-perfused Resp: normal  WOB  Neurologic exam MS: alert, oriented x4, follows commands Speech: dysarthria, no aphasia CN: PERRL, VFF, EOMI, sensation intact, face symmetric, hearing intact to voice Motor: drift to bed LLE, drift but not to bed RLE Sensory: SILT Reflexes: 2+ symm with toes down bilat Coordination: FNF intact bilat Gait: deferred   Labs/Imaging/Neurodiagnostic studies   CBC:  Recent Labs  Lab Dec 15, 2023 1151 12/13/23 0414  WBC 4.8 5.3  NEUTROABS  --  2.1  HGB 17.5* 15.1  HCT 48.7 41.5  MCV 89.9 89.2  PLT 276 265   Basic Metabolic Panel:  Lab Results  Component Value Date   NA 136 12/13/2023   K 3.0 (L) 12/13/2023   CO2 26 12/13/2023   GLUCOSE 107 (H) 12/13/2023   BUN 28 (H) 12/13/2023   CREATININE 0.88 12/13/2023   CALCIUM  8.8 (L) 12/13/2023   GFRNONAA >60 12/13/2023   GFRAA >60 03/29/2012   Lipid Panel:  Lab Results  Component Value Date   LDLCALC 66 12/12/2023   HgbA1c:  Lab Results  Component Value Date   HGBA1C 5.3 Dec 15, 2023   Urine Drug Screen:     Component Value Date/Time   LABOPIA NONE DETECTED 2023-12-15 1347   COCAINSCRNUR POSITIVE (A) 2023/12/15 1347   LABBENZ NONE DETECTED 2023-12-15 1347   AMPHETMU NONE DETECTED 12-15-23 1347   THCU POSITIVE (A) Dec 15, 2023 1347   LABBARB NONE DETECTED 12-15-23 1347    Alcohol Level No results found for: ETH INR No results found for: INR APTT No results found for: APTT AED levels: No results found for: PHENYTOIN, ZONISAMIDE, LAMOTRIGINE, LEVETIRACETA  CT angio Head and Neck with contrast(Personally reviewed): 1. No acute intracranial hemorrhage or ischemic change. 2. 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 3. Moderate-to-severe stenosis of the cavernous and ophthalmic segments of the left internal carotid artery and moderate stenosis of the anterior genu of the right cavernous segment. 4. Moderate-to-severe stenosis of the a2 branch of  the right anterior cerebral artery. 5. Mild-to-moderate stenosis of the proximal basilar artery.  MRI Brain(Personally reviewed): 1. Acute right ACA distribution infarct. No associated hemorrhage or mass effect. 2. Underlying moderately advanced chronic microvascular ischemic disease with a few scattered remote lacunar infarcts involving the hemispheric cerebral white matter, deep gray nuclei, and left cerebellum. 3. Encephalomalacia and gliosis involving the anterior right temporal lobe, which could be related  to prior trauma and/or ischemia.  TTE 1. Left ventricular ejection fraction, by estimation, is 55 to 60% . The left ventricle has normal function. The left ventricle has no regional wall motion abnormalities. Left ventricular diastolic parameters were normal. 2. Right ventricular systolic function is normal. The right ventricular size is normal. 3. The mitral valve is normal in structure. Trivial mitral valve regurgitation. No evidence of mitral stenosis. 4. The aortic valve is normal in structure. Aortic valve regurgitation is not visualized. No aortic stenosis is present. 5. The inferior vena cava is normal in size with greater than 50% respiratory variability, suggesting right atrial pressure of 3 mmHg.  ASSESSMENT   Scott Yoder. is a 68 y.o. male with hx of CAD status post CABG 2015, hypertension, hyperlipidemia who presented to the ED for the third time since Sunday due to lower extremity weakness left greater than right as well as mild confusion and was found to have an acute R ACA infarct likely 2/2 large vessel cerebrovascular disease. Stroke workup is completed.  RECOMMENDATIONS   - ASA 81mg  daily + plavix  75mg  daily x90 days f/b plavix  75mg  daily monotherapy after that - Ambulatory referral to neurology at discharge - Neurology to be available prn for questions  ______________________________________________________________________    Signed, Elida CHRISTELLA Ross, MD [facility]

## 2023-12-13 NOTE — Plan of Care (Signed)
  Problem: Education: Goal: Knowledge of disease or condition will improve Outcome: Progressing Goal: Knowledge of secondary prevention will improve (MUST DOCUMENT ALL) Outcome: Progressing Goal: Knowledge of patient specific risk factors will improve (DELETE if not current risk factor) Outcome: Progressing   Problem: Ischemic Stroke/TIA Tissue Perfusion: Goal: Complications of ischemic stroke/TIA will be minimized Outcome: Progressing   Problem: Coping: Goal: Will verbalize positive feelings about self Outcome: Progressing Goal: Will identify appropriate support needs Outcome: Progressing   Problem: Health Behavior/Discharge Planning: Goal: Ability to manage health-related needs will improve Outcome: Progressing Goal: Goals will be collaboratively established with patient/family Outcome: Progressing   Problem: Self-Care: Goal: Ability to participate in self-care as condition permits will improve Outcome: Progressing   Problem: Elimination: Goal: Will not experience complications related to bowel motility Outcome: Progressing Goal: Will not experience complications related to urinary retention Outcome: Progressing   Problem: Pain Managment: Goal: General experience of comfort will improve and/or be controlled Outcome: Progressing

## 2023-12-13 NOTE — Plan of Care (Signed)
 Patient has a R ACA inarct 2/2 severe intracranial stenosis. TTE pending. Full consult note to follow.  Elida Ross, MD [facility] [phone]  If 7pm- 7am, please page neurology on call as listed in [facility].

## 2023-12-13 NOTE — Progress Notes (Signed)
 Physical Therapy Treatment Patient Details Name: Scott Yoder. MRN: [id number] DOB: Sep 27, 1955 Today's Date: 12/13/2023   History of Present Illness 68 y/o male presented to ED on 12/11/23 for lower extremity weakness, mild confusion, and elevated BP. MRI showed acute R ACA infarct. PMH: HTN, CAD s/p CABG 2015, HLD    PT Comments  Patient is pleasant, oriented, agrees to PT session. He requests a urinal.  Patient is mod I for bed mobility with cues to get all the way around with feet on floor prior to standing. Patient is able to stand with cga. Used urinal in standing with cga. He was able to ambulate ~50 feet with RW and cga. Distance limited by patient wanting to get back to eat breakfast and then was distracted by trying to call brother, unsuccessfully. Patient mostly limited by cognition during mobility. He will continue to benefit from skilled PT to improve independence and safety with mobility.         If plan is discharge home, recommend the following: A little help with walking and/or transfers;A little help with bathing/dressing/bathroom;Supervision due to cognitive status   Can travel by private vehicle      yes  Equipment Recommendations  Other (comment) (TBD)    Recommendations for Other Services       Precautions / Restrictions Precautions Precautions: Fall Recall of Precautions/Restrictions: Intact Restrictions Weight Bearing Restrictions Per Provider Order: No     Mobility  Bed Mobility Overal bed mobility: Needs Assistance Bed Mobility: Supine to Sit, Sit to Supine     Supine to sit: Supervision Sit to supine: Supervision        Transfers Overall transfer level: Needs assistance Equipment used: Rolling walker (2 wheels) Transfers: Sit to/from Stand Sit to Stand: Supervision           General transfer comment: patient able to stand and use urinal with cga.    Ambulation/Gait Ambulation/Gait assistance: Contact guard assist Gait Distance  (Feet): 50 Feet Assistive device: Rolling walker (2 wheels) Gait Pattern/deviations: Step-through pattern, Decreased step length - right, Decreased step length - left, Decreased stride length Gait velocity: decr     General Gait Details: patient ambulated out into hallway, says he wants to go back to eat breakfast. Some cues needed for staying close to [employer]             Wheelchair Mobility     Tilt Bed    Modified Rankin (Stroke Patients Only)       Balance Overall balance assessment: Needs assistance Sitting-balance support: Feet supported Sitting balance-Leahy Scale: Good     Standing balance support: Bilateral upper extremity supported, During functional activity, Reliant on assistive device for balance Standing balance-Leahy Scale: Good                              Communication Communication Communication: Impaired Factors Affecting Communication: Difficulty expressing self  Cognition Arousal: Alert Behavior During Therapy: WFL for tasks assessed/performed   PT - Cognitive impairments: Awareness, Attention, Safety/Judgement, Problem solving                         Following commands: Impaired Following commands impaired: Follows one step commands with increased time    Cueing Cueing Techniques: Verbal cues, Gestural cues, Tactile cues  Exercises      General Comments        Pertinent Vitals/Pain Pain Assessment Pain  Assessment: No/denies pain    Home Living                          Prior Function            PT Goals (current goals can now be found in the care plan section) Acute Rehab PT Goals Patient Stated Goal: to figure things out PT Goal Formulation: With patient Time For Goal Achievement: 12/26/23 Potential to Achieve Goals: Good Progress towards PT goals: Progressing toward goals    Frequency    Min 3X/week      PT Plan      Co-evaluation              AM-PAC PT 6 Clicks  Mobility   Outcome Measure  Help needed turning from your back to your side while in a flat bed without using bedrails?: A Little Help needed moving from lying on your back to sitting on the side of a flat bed without using bedrails?: A Little Help needed moving to and from a bed to a chair (including a wheelchair)?: A Little Help needed standing up from a chair using your arms (e.g., wheelchair or bedside chair)?: A Little Help needed to walk in hospital room?: A Little Help needed climbing 3-5 steps with a railing? : A Little 6 Click Score: 18    End of Session Equipment Utilized During Treatment: Gait belt Activity Tolerance: Patient tolerated treatment well Patient left: in bed;with call bell/phone within reach Nurse Communication: Mobility status PT Visit Diagnosis: Muscle weakness (generalized) (M62.81);Other abnormalities of gait and mobility (R26.89)     Time: 9069-9049 PT Time Calculation (min) (ACUTE ONLY): 20 min  Charges:    $Gait Training: 8-22 mins PT General Charges $$ ACUTE PT VISIT: 1 Visit                     Oron Westrup, PT, GCS 12/13/23,10:22 AM

## 2023-12-13 NOTE — Progress Notes (Signed)
 Progress Note   Patient: Scott Yoder. FMW:[id number] DOB: 30-Jul-1955 DOA: 12/11/2023     2 DOS: the patient was seen and examined on 12/13/2023     Brief hospital course: From HPI Kelvyn Mickie Kozikowski. is a 68 y.o. male with medical history significant of CAD s/p CABG 2015, HTN, HLD , per patient not on statin as was taken of medication, who presents to ED for the third time since Sunday due to lower extremity weakness Left >right as well as mild confusion. Per patient he woke up on Sunday with these symptoms. He notes symptoms are not improving. He notes no new vision changes, no upper extremity weakness,  no chest pain, sob/ fever/chills n/v/d or abdominal pain . Per RN patient has had intermittent episode of slurred speech in ED.      Assessment and Plan: Acute ischemic stroke -slurred speech/ confusion intermittent ,leg weakness - CT head no acute findings but noted old infarcts no prior dx of CVA in the past that patient remembers, neuro exam no mild drift on left Neurologist on board and case discussed Continue aspirin /as well as Plavix  According to neurology Dr. Matthews patient should be discharged on DAPT x90 days f/b plavix  75mg  daily 2/2 severe intracranial stenosis  MRI of the brain showed acute right ICA infarct Continue PT OT PT OT recommending acute rehab placement   Hypertensive Emergency  - Continue blood pressure monitoring Has completed 48 hours of permissive hypertension Some of his home antihypertensives were resumed for gradual BP reduction     Multiple drug abuse Patient's U tox positive for cannabis and cocaine Counseled on cessation We will monitor closely for withdrawals   HLD -not on statin currently  -note to have mild elevated lfts  Lipid panel shows LDL 66 - hold statin for now    Mild Elevated LFTs - liver u/s    CAD s/p CABG Has completed 48 hours of permissive hypertension -continue asa   DVT prophylaxis: scd Code Status: full/ as  discussed per patient wishes in event of cardiac arrest     Disposition Plan: Pending medical stabilization   Consults called: Neurology   Subjective:  Patient seen and examined at bedside this morning in the presence of the brother He looks more awake today compared to yesterday Denies nausea vomiting abdominal pain chest pain   Physical Exam: Eyes: pupils equal, lids and conjunctivae normal ENMT: Mucous membranes are moist. Posterior pharynx clear of any exudate or lesions. Neck: normal, supple, no masses, no thyromegaly Respiratory: clear to auscultation bilaterally, no wheezing, no crackles. Normal respiratory effort. No accessory muscle use.  Cardiovascular: Regular rate and rhythm, no murmurs / rubs / gallops. No extremity edema. 2+ pedal pulses.  Abdomen: no tenderness, no masses palpated. No hepatosplenomegaly. Bowel sounds positive.  Musculoskeletal: no clubbing / cyanosis. No joint deformity upper and lower extremities. Good ROM, no contractures. Normal muscle tone.  Skin: no rashes, lesions, ulcers. No induration Neurologic: CN  grossly intact. Sensation intact, Strength 5/5 in all 4. patient does appear to not be able to hold leg up on left as on right but strength is 5/5 . Holding left leg up for 5 seconds notes drift but does not touch bed.  Psychiatric: Normal judgment and insight. Alert and oriented x 3. Normal mood.      Vitals:   12/13/23 1400 12/13/23 1500 12/13/23 1600 12/13/23 1610  BP: (!) 181/94 (!) 174/122 (!) 209/121 (!) 181/126  Pulse: 67 72 74 (!) 111  Resp:  17 (!) 24 13 (!) 21  Temp:      TempSrc:      SpO2: 95% 99% 98% 97%  Weight:      Height:        Data Reviewed:    Latest Ref Rng & Units 12/13/2023    4:14 AM 12/11/2023   11:51 AM 12/10/2023    4:25 PM  CBC  WBC 4.0 - 10.5 K/uL 5.3  4.8  4.7   Hemoglobin 13.0 - 17.0 g/dL 84.8  82.4  83.8   Hematocrit 39.0 - 52.0 % 41.5  48.7  45.2   Platelets 150 - 400 K/uL 265  276  256        Latest  Ref Rng & Units 12/13/2023    4:14 AM 12/11/2023   11:51 AM 12/10/2023    4:25 PM  BMP  Glucose 70 - 99 mg/dL 892  892  898   BUN 8 - 23 mg/dL 28  18  14    Creatinine 0.61 - 1.24 mg/dL 9.11  9.14  9.14   Sodium 135 - 145 mmol/L 136  138  140   Potassium 3.5 - 5.1 mmol/L 3.0  3.8  3.5   Chloride 98 - 111 mmol/L 101  99  102   CO2 22 - 32 mmol/L 26  28  26    Calcium  8.9 - 10.3 mg/dL 8.8  9.7  9.4       Author: Drue ONEIDA Potter, MD 12/13/2023 5:15 PM  For on call review [email or web address].

## 2023-12-13 NOTE — Progress Notes (Signed)
 Speech Language Pathology Treatment: Cognitive-Linguistic  Patient Details Name: Scott Yoder. MRN: [id number] DOB: November 04, 1955 Today's Date: 12/13/2023 Time: 8764-8749 SLP Time Calculation (min) (ACUTE ONLY): 15 min  Assessment / Plan / Recommendation Clinical Impression  Pt seen for ongoing cognitive assessment. Pt's brother was present throughout the session. Pt was pre-occupied with counting money from his wallet throughout the session, difficult to redirect full attention to ST tasks.   The [facility] Mental Status (SLUMS) exam was administered.   The [employer] Mental Status (SLUMS) Examination was administered. Pt scored **/30, raising concern for the presence of a neurocognitive disorder. Further testing would be beneficial, as deficits of attention, memory, oriantion, problem solving, and executive functions identified today may negatively impact pt safety with independent living.   SLUMS Examination Orientation  3/3  Numeric Problem Solving  2/3  Memory  0/5 - pt stated I blocked it out of my mind because you had me doing something else.  Attention 1/2  Thought Organization 3/3  Clock Drawing 2/4  Visuospatial Skills               2/2  Short Story Recall  4/8  Total  17/30     Scoring  High School Education  Less than High School Education   Normal  27-30 25-30  Mild Neurocognitive Disorder 21-26 20-24  Dementia  1-20 1-19   Of note, pt's brother states that pt is at overall baseline level but maybe just a little weaker.  Given report by pt and his brother of being at baseline ability, ST services will sign off.    HPI HPI: 68 y/o male presented to ED on 12/11/23 for lower extremity weakness, mild confusion, and elevated BP. PMH: HTN, CAD s/p CABG 2015, HLD, alcoholism. MRi revealed Acute right ACA distribution infarct. No associated hemorrhage or  mass effect.  2. Underlying moderately advanced chronic microvascular ischemic  disease with  a few scattered remote lacunar infarcts involving the  hemispheric cerebral white matter, deep gray nuclei, and left  cerebellum.  3. Encephalomalacia and gliosis involving the anterior right  temporal lobe, which could be related to prior trauma and/or  ischemia.      SLP Plan  Continue with current plan of care          Recommendations                     Oral care BID   Frequent or constant Supervision/Assistance Cognitive communication deficit (R41.841);Frontal lobe and executive function deficit;Attention and concentration deficit Other cerebrovascular disease Other cerebrovascular disease Continue with current plan of care     Rosamary Boudreau  12/13/2023, 2:12 PM

## 2023-12-13 NOTE — Progress Notes (Signed)
 Echocardiogram 2D Echocardiogram has been performed.  Scott Yoder 12/13/2023, 10:49 AM

## 2023-12-13 NOTE — Progress Notes (Signed)
  Inpatient Rehabilitation Admissions Coordinator   Patient has progressed to CGA assist. CIR beds at [facility] unavailable until likely mid week next week. Recommend other rehab venues to b pursued.  Heron Leavell, RN, MSN Rehab Admissions Coordinator [phone] 12/13/2023 4:12 PM

## 2023-12-13 NOTE — Progress Notes (Signed)
 Occupational Therapy Treatment Patient Details Name: Scott Yoder. MRN: [id number] DOB: 1955/12/07 Today's Date: 12/13/2023   History of present illness 68 y/o male presented to ED on 12/11/23 for lower extremity weakness, mild confusion, and elevated BP. MRI showed acute R ACA infarct. PMH: HTN, CAD s/p CABG 2015, HLD   OT comments  Pt is supine in bed on arrival. [place] and agreeable to OT session. He denies pain. Pt performed bed mobility with supervision, STS from EOB with supervision and utilization of urinal in standing by bed with SBA for safety. He ambulated within the room ~10-12 ft to toilet in room to empty urinal. Pt demo standing oral care with unilateral support on sink needed to maintain balance as he has occasional posterior lean with increased standing time and CGA from therapist. Pt returned to seated EOB and was assisted with tray set up to eat his lunch. BP on entry 181/94 and at end of session was 191/113. Nurse aware and pt reports feeling fine. He continues to have deficits in his reasoning/organization and increased time to process and initiate tasks. Pt left seated EOB with all needs in place and will cont to require skilled acute OT services to maximize his safety and IND to return to PLOF.       If plan is discharge home, recommend the following:  A little help with walking and/or transfers;A little help with bathing/dressing/bathroom;Assist for transportation;Supervision due to cognitive status;Direct supervision/assist for medications management   Equipment Recommendations  BSC/3in1    Recommendations for Other Services      Precautions / Restrictions Precautions Precautions: Fall Recall of Precautions/Restrictions: Intact Restrictions Weight Bearing Restrictions Per Provider Order: No       Mobility Bed Mobility Overal bed mobility: Needs Assistance Bed Mobility: Supine to Sit     Supine to sit: Supervision     General bed mobility  comments: left seated at EOB to eat his lunch at end of session    Transfers Overall transfer level: Needs assistance Equipment used: Rolling walker (2 wheels) Transfers: Sit to/from Stand Sit to Stand: Supervision           General transfer comment: pt requesting not to use RW within room, CGA provided from therapist and pt reaching out for objects to grab for balance; unilateral support for standing balance during oral care     Balance Overall balance assessment: Needs assistance Sitting-balance support: Feet supported Sitting balance-Leahy Scale: Good     Standing balance support: Bilateral upper extremity supported, During functional activity, Reliant on assistive device for balance Standing balance-Leahy Scale: Good                             ADL either performed or assessed with clinical judgement   ADL Overall ADL's : Needs assistance/impaired     Grooming: Oral care;Standing;Supervision/safety                       Toileting- Clothing Manipulation and Hygiene: Supervision/safety;Sit to/from stand Toileting - Clothing Manipulation Details (indicate cue type and reason): standing at bedside for urinal use and able to empty into toilet and flush during session     Functional mobility during ADLs: Contact guard assist      Extremity/Trunk Assessment              Vision       Perception     Praxis     Communication  Communication Communication: Impaired Factors Affecting Communication: Difficulty expressing self   Cognition Arousal: Alert Behavior During Therapy: WFL for tasks assessed/performed             Executive functioning impairment (select all impairments): Organization, Reasoning, Problem solving                   Following commands: Impaired Following commands impaired: Follows one step commands with increased time      Cueing   Cueing Techniques: Verbal cues, Gestural cues, Tactile cues  Exercises       Shoulder Instructions       General Comments      Pertinent Vitals/ Pain       Pain Assessment Pain Assessment: No/denies pain  Home Living                                          Prior Functioning/Environment              Frequency  Min 3X/week        Progress Toward Goals  OT Goals(current goals can now be found in the care plan section)  Progress towards OT goals: Progressing toward goals  Acute Rehab OT Goals Patient Stated Goal: get better OT Goal Formulation: With patient Time For Goal Achievement: 12/26/23 Potential to Achieve Goals: Good  Plan      Co-evaluation                 AM-PAC OT 6 Clicks Daily Activity     Outcome Measure   Help from another person eating meals?: None Help from another person taking care of personal grooming?: None Help from another person toileting, which includes using toliet, bedpan, or urinal?: A Little Help from another person bathing (including washing, rinsing, drying)?: A Little Help from another person to put on and taking off regular upper body clothing?: None Help from another person to put on and taking off regular lower body clothing?: A Little 6 Click Score: 21    End of Session    OT Visit Diagnosis: Other abnormalities of gait and mobility (R26.89);Muscle weakness (generalized) (M62.81)   Activity Tolerance Patient tolerated treatment well   Patient Left in bed;with call bell/phone within reach;with bed alarm set   Nurse Communication Mobility status        Time: 1421-1450 OT Time Calculation (min): 29 min  Charges: OT General Charges $OT Visit: 1 Visit OT Treatments $Self Care/Home Management : 23-37 mins  Darrill Vreeland, OTR/L  12/13/23, 3:07 PM   Tarrin Menn E Quetzal Meany 12/13/2023, 3:03 PM

## 2023-12-14 DIAGNOSIS — G459 Transient cerebral ischemic attack, unspecified: Secondary | ICD-10-CM | POA: Diagnosis not present

## 2023-12-14 LAB — CBC WITH DIFFERENTIAL/PLATELET
Abs Immature Granulocytes: 0.01 K/uL (ref 0.00–0.07)
Basophils Absolute: 0 K/uL (ref 0.0–0.1)
Basophils Relative: 1 %
Eosinophils Absolute: 0.1 K/uL (ref 0.0–0.5)
Eosinophils Relative: 2 %
HCT: 44.8 % (ref 39.0–52.0)
Hemoglobin: 16.1 g/dL (ref 13.0–17.0)
Immature Granulocytes: 0 %
Lymphocytes Relative: 42 %
Lymphs Abs: 2.2 K/uL (ref 0.7–4.0)
MCH: 32.1 pg (ref 26.0–34.0)
MCHC: 35.9 g/dL (ref 30.0–36.0)
MCV: 89.4 fL (ref 80.0–100.0)
Monocytes Absolute: 0.6 K/uL (ref 0.1–1.0)
Monocytes Relative: 11 %
Neutro Abs: 2.3 K/uL (ref 1.7–7.7)
Neutrophils Relative %: 44 %
Platelets: 261 K/uL (ref 150–400)
RBC: 5.01 MIL/uL (ref 4.22–5.81)
RDW: 12.8 % (ref 11.5–15.5)
WBC: 5.3 K/uL (ref 4.0–10.5)
nRBC: 0 % (ref 0.0–0.2)

## 2023-12-14 LAB — GLUCOSE, CAPILLARY: Glucose-Capillary: 126 mg/dL — ABNORMAL HIGH (ref 70–99)

## 2023-12-14 LAB — BASIC METABOLIC PANEL WITH GFR
Anion gap: 8 (ref 5–15)
BUN: 23 mg/dL (ref 8–23)
CO2: 24 mmol/L (ref 22–32)
Calcium: 9.3 mg/dL (ref 8.9–10.3)
Chloride: 104 mmol/L (ref 98–111)
Creatinine, Ser: 0.63 mg/dL (ref 0.61–1.24)
GFR, Estimated: >60 mL/min (ref >60)
Glucose, Bld: 104 mg/dL — ABNORMAL HIGH (ref 70–99)
Potassium: 3.1 mmol/L — ABNORMAL LOW (ref 3.5–5.1)
Sodium: 136 mmol/L (ref 135–145)

## 2023-12-14 MED ORDER — ATORVASTATIN CALCIUM 20 MG PO TABS
40.0000 mg | ORAL_TABLET | Freq: Every day | ORAL | Status: DC
  Administered 2023-12-14 – 2023-12-18 (×5): 40 mg via ORAL
  Filled 2023-12-14 (×5): qty 2

## 2023-12-14 MED ORDER — BRIMONIDINE TARTRATE 0.15 % OP SOLN
1.0000 [drp] | Freq: Three times a day (TID) | OPHTHALMIC | Status: DC
  Administered 2023-12-14 – 2023-12-18 (×13): 1 [drp] via OPHTHALMIC
  Filled 2023-12-14 (×2): qty 5

## 2023-12-14 MED ORDER — POTASSIUM CHLORIDE CRYS ER 20 MEQ PO TBCR
40.0000 meq | EXTENDED_RELEASE_TABLET | ORAL | Status: AC
  Administered 2023-12-14 (×2): 40 meq via ORAL
  Filled 2023-12-14 (×2): qty 2

## 2023-12-14 MED ORDER — LACTULOSE 10 GM/15ML PO SOLN
30.0000 g | Freq: Two times a day (BID) | ORAL | Status: DC | PRN
  Administered 2023-12-14 – 2023-12-15 (×2): 30 g via ORAL
  Filled 2023-12-14 (×2): qty 60

## 2023-12-14 MED ORDER — ESCITALOPRAM OXALATE 10 MG PO TABS
10.0000 mg | ORAL_TABLET | Freq: Every day | ORAL | Status: DC
  Administered 2023-12-14 – 2023-12-18 (×5): 10 mg via ORAL
  Filled 2023-12-14 (×5): qty 1

## 2023-12-14 MED ORDER — AMLODIPINE BESYLATE 10 MG PO TABS
10.0000 mg | ORAL_TABLET | Freq: Every day | ORAL | Status: DC
  Administered 2023-12-15 – 2023-12-18 (×4): 10 mg via ORAL
  Filled 2023-12-14 (×4): qty 1

## 2023-12-14 MED ORDER — ONDANSETRON HCL 4 MG/2ML IJ SOLN
4.0000 mg | Freq: Four times a day (QID) | INTRAMUSCULAR | Status: DC | PRN
  Administered 2023-12-14: 4 mg via INTRAVENOUS
  Filled 2023-12-14: qty 2

## 2023-12-14 MED ORDER — LISINOPRIL 20 MG PO TABS
20.0000 mg | ORAL_TABLET | Freq: Every day | ORAL | Status: DC
  Administered 2023-12-14 – 2023-12-18 (×5): 20 mg via ORAL
  Filled 2023-12-14 (×5): qty 1

## 2023-12-14 NOTE — Plan of Care (Signed)
 Pt transfer from ICU;alert and oriented x4; resting in bed, vitals stable. No significant changes. Problem: Coping: Goal: Will verbalize positive feelings about self Outcome: Progressing   Problem: Ischemic Stroke/TIA Tissue Perfusion: Goal: Complications of ischemic stroke/TIA will be minimized Outcome: Progressing   Problem: Self-Care: Goal: Ability to participate in self-care as condition permits will improve Outcome: Progressing   Problem: Clinical Measurements: Goal: Ability to maintain clinical measurements within normal limits will improve Outcome: Progressing

## 2023-12-14 NOTE — TOC Initial Note (Addendum)
 Transition of Care (TOC) - Initial/Assessment Note    Patient Details  Name: Scott Yoder. MRN: [id number] Date of Birth: [birth year]  Transition of Care [facility]) CM/SW Contact:    [place] L Lamira Borin, LCSW Phone Number: 12/14/2023, 3:30 PM  Clinical Narrative:                   Brief assessment completed. SW met with patient. Physical Therapy was at bedside. Patient was sitting up in a chair. CSW and patient discussed recommendations. Patient advised that he lives at home and he has support.   SNF vs HHPT was discussed. Physical Therapy advised that they were taking patient for a walk and will determine whether or not he is progressing. Patient and CSW agreed to revisit the conversations at a later time, after PT enters notes. Patient seemed uncertain about going to a SNF.   SNF workup not completed. Will await PT/OT.  Not eligible for CIR.      Patient Goals and CMS Choice            Expected Discharge Plan and Services                                              Prior Living Arrangements/Services                       Activities of Daily Living   ADL Screening (condition at time of admission) Independently performs ADLs?: No Does the patient have a NEW difficulty with bathing/dressing/toileting/self-feeding that is expected to last >3 days?: Yes (Initiates electronic notice to provider for possible OT consult) Does the patient have a NEW difficulty with getting in/out of bed, walking, or climbing stairs that is expected to last >3 days?: Yes (Initiates electronic notice to provider for possible PT consult) Does the patient have a NEW difficulty with communication that is expected to last >3 days?: No Is the patient deaf or have difficulty hearing?: No Does the patient have difficulty seeing, even when wearing glasses/contacts?: No Does the patient have difficulty concentrating, remembering, or making decisions?: No  Permission Sought/Granted                   Emotional Assessment              Admission diagnosis:  TIA (transient ischemic attack) [G45.9] Left leg weakness [R29.898] Left arm weakness [R29.898] Patient Active Problem List   Diagnosis Date Noted   TIA (transient ischemic attack) 12/11/2023   Atherosclerosis of coronary artery 07/31/2014   Diabetes (HCC) 07/31/2014   BP (high blood pressure) 07/31/2014   Coronary artery disease    Hernia, inguinal 07/14/2014   PCP:  Care, [place] [facility]:   [facility] [address]), [place] - [address] [place]) [place] [address] Phone: [phone] Fax: [phone]     Social Drivers of Health (SDOH) Social History: SDOH Screenings   Food Insecurity: No Food Insecurity (12/11/2023)  Housing: Low Risk  (12/11/2023)  Transportation Needs: No Transportation Needs (12/11/2023)  Utilities: Not At Risk (12/11/2023)  Financial Resource Strain: Low Risk  (10/05/2022)   Received from [facility]  Social Connections: Unknown (12/11/2023)  Tobacco Use: Low Risk  (12/11/2023)   SDOH Interventions:     Readmission Risk Interventions     No data  to display

## 2023-12-14 NOTE — Progress Notes (Signed)
 Patient transferred with his belongings to room 110 without incident. Patient belongings include cell phone, phone charger, wallet, keys and shoes.

## 2023-12-14 NOTE — Progress Notes (Signed)
 Called to patient's room by NT who had patient in bathroom. Patient had ambulated to bathroom with assistance and had syncopal episode when sitting down on toilet. Patient immediately became very hot and visibly anxious. Patient with diaphoresis. Patient transferred with assist x2 from toilet to w/c, at which time he had another near syncopal episode. Assisted from w/c to bed via lift with assist x3. Patient blood pressure noted to be 139/107 with a pulse of 55. Patient nauseous and dry heaving. MD made aware of above events. Order obtained for prn zofran .  Patient's symptoms resolved by 1812.

## 2023-12-14 NOTE — Progress Notes (Signed)
 Nursing staff called to patient's room. Pt concerned about his eye drops that he needs to pick up from the pharmacy. Called [facility] pharmacy and confirmed he has a pending pick up for Brimonidine  tartrate eye drops. Per pharmacy, patient has 5 more days to pick them up.  MD made aware of rx.

## 2023-12-14 NOTE — Progress Notes (Addendum)
 Progress Note   Patient: Scott Yoder. FMW:[id number] DOB: [birth year] DOA: 12/11/2023     3 DOS: the patient was seen and examined on 12/14/2023    Brief hospital course: From HPI Scott Yoder. is a 68 y.o. male with medical history significant of CAD s/p CABG 2015, HTN, HLD , per patient not on statin as was taken of medication, who presents to ED for the third time since Sunday due to lower extremity weakness Left >right as well as mild confusion. Per patient he woke up on Sunday with these symptoms. He notes symptoms are not improving. He notes no new vision changes, no upper extremity weakness,  no chest pain, sob/ fever/chills n/v/d or abdominal pain . Per RN patient has had intermittent episode of slurred speech in ED.      Assessment and Plan: Acute ischemic stroke -slurred speech/ confusion intermittent ,leg weakness - CT head no acute findings but noted old infarcts no prior dx of CVA in the past that patient remembers, neuro exam no mild drift on left Neurologist on board and case discussed Continue aspirin /as well as Plavix  According to neurology Dr. Matthews patient should be discharged on DAPT x90 days f/b plavix  75mg  daily 2/2 severe intracranial stenosis  MRI of the brain showed acute right ICA infarct Continue PT OT PT OT recommending acute rehab placement   Hypertensive Emergency  - Continue blood pressure monitoring Has completed 48 hours of permissive hypertension Some of his home antihypertensives were resumed for gradual BP reduction   Hypokalemia-continue repletion and monitoring  Multiple drug abuse Patient's U tox positive for cannabis and cocaine Counseled on cessation We will monitor closely for withdrawals   HLD -not on statin currently  -note to have mild elevated lfts  Lipid panel shows LDL 66 Patient on statin therapy at home   Mild Elevated LFTs Gallbladder polyp Abdominal ultrasound did not show any cholelith cholelithiasis or  findings of acute cholecystitis. To have follow-up imaging in 2 years   CAD s/p CABG Has completed 48 hours of permissive hypertension Continue aspirin  therapy   DVT prophylaxis: scd Code Status: full/ as discussed per patient wishes in event of cardiac arrest     Disposition Plan: Pending medical stabilization   Consults called: Neurology   Subjective:  Patient has been cleared by neurologist Currently pending skilled nursing facility/rehab placement He denies nausea vomiting abdominal pain no chest pain   Physical Exam: Eyes: pupils equal, lids and conjunctivae normal ENMT: Mucous membranes are moist. Posterior pharynx clear of any exudate or lesions. Neck: normal, supple, no masses, no thyromegaly Respiratory: clear to auscultation bilaterally, no wheezing, no crackles. Normal respiratory effort. No accessory muscle use.  Cardiovascular: Regular rate and rhythm, no murmurs / rubs / gallops. No extremity edema. 2+ pedal pulses.  Abdomen: no tenderness, no masses palpated. No hepatosplenomegaly. Bowel sounds positive.  Musculoskeletal: no clubbing / cyanosis. No joint deformity upper and lower extremities. Good ROM, no contractures. Normal muscle tone.  Skin: no rashes, lesions, ulcers. No induration Neurologic: CN  grossly intact. Sensation intact, Strength 5/5 in all 4. patient does appear to not be able to hold leg up on left as on right but strength is 5/5 . Holding left leg up for 5 seconds notes drift but does not touch bed.  Psychiatric: Normal judgment and insight. Alert and oriented x 3. Normal mood.       Data Reviewed:  Vitals:   12/14/23 0515 12/14/23 0915 12/14/23 1017 12/14/23 1313  BP: ROLLEN)  152/84 (!) 154/99  (!) 158/101  Pulse: 67 61 64 66  Resp: 18 18  15   Temp: 97.8 F (36.6 C) 98.8 F (37.1 C)  98.5 F (36.9 C)  TempSrc: Oral Oral    SpO2: 98% 100%  98%  Weight:      Height:          Latest Ref Rng & Units 12/14/2023    5:02 AM 12/13/2023    4:14  AM 12/11/2023   11:51 AM  CBC  WBC 4.0 - 10.5 K/uL 5.3  5.3  4.8   Hemoglobin 13.0 - 17.0 g/dL 83.8  84.8  82.4   Hematocrit 39.0 - 52.0 % 44.8  41.5  48.7   Platelets 150 - 400 K/uL 261  265  276        Latest Ref Rng & Units 12/14/2023    5:02 AM 12/13/2023    4:14 AM 12/11/2023   11:51 AM  BMP  Glucose 70 - 99 mg/dL 895  892  892   BUN 8 - 23 mg/dL 23  28  18    Creatinine 0.61 - 1.24 mg/dL 9.36  9.11  9.14   Sodium 135 - 145 mmol/L 136  136  138   Potassium 3.5 - 5.1 mmol/L 3.1  3.0  3.8   Chloride 98 - 111 mmol/L 104  101  99   CO2 22 - 32 mmol/L 24  26  28    Calcium  8.9 - 10.3 mg/dL 9.3  8.8  9.7      Author: Drue ONEIDA Potter, MD 12/14/2023 1:34 PM  For on call review [email or web address].

## 2023-12-14 NOTE — Progress Notes (Addendum)
 Physical Therapy Treatment Patient Details Name: Scott Yoder. MRN: [id number] DOB: 1956-03-02 Today's Date: 12/14/2023   History of Present Illness 68 y/o male presented to ED on 12/11/23 for lower extremity weakness, mild confusion, and elevated BP. MRI showed acute R ACA infarct. PMH: HTN, CAD s/p CABG 2015, HLD    PT Comments  Patient received in recliner. He has flat affect and is slow to respond at times. Patient is agreeable to PT session. Again patient asking for assistance to call people throughout session. Patient stands with supervision and cues. He ambulated into bathroom with cues for task completion, then ambulated around nursing station with directional and safety cues using RW and cga. Patient lives alone at baseline and I spoke with his sister who states she cannot stay with him. Patient does have a daughter, who I attempted reach out to to see if she can assist at all, left voice mail. Otherwise patient will benefit from <3 hours of therapy to improve safety and independence.       If plan is discharge home, recommend the following: A little help with walking and/or transfers;A little help with bathing/dressing/bathroom;Supervision due to cognitive status;Direct supervision/assist for medications management;Assistance with cooking/housework;Help with stairs or ramp for entrance   Can travel by private vehicle     Yes  Equipment Recommendations  Rolling walker (2 wheels)    Recommendations for Other Services       Precautions / Restrictions Precautions Precautions: Fall Recall of Precautions/Restrictions: Impaired Restrictions Weight Bearing Restrictions Per Provider Order: No     Mobility  Bed Mobility               General bed mobility comments: NT patient received in recliner and remained in recliner    Transfers Overall transfer level: Needs assistance Equipment used: Rolling walker (2 wheels) Transfers: Sit to/from Stand Sit to Stand:  Supervision           General transfer comment: mod cues needed for safety.    Ambulation/Gait Ambulation/Gait assistance: Contact guard assist Gait Distance (Feet): 150 Feet Assistive device: Rolling walker (2 wheels) Gait Pattern/deviations: Step-through pattern, Decreased step length - right, Decreased step length - left, Decreased stride length Gait velocity: decr     General Gait Details: Cues needed for direction, obstacle avoidance. Patient also requires cues for safety with mobility and staying close to [employer] Mobility     Tilt Bed    Modified Rankin (Stroke Patients Only)       Balance Overall balance assessment: Needs assistance Sitting-balance support: Feet supported Sitting balance-Leahy Scale: Good     Standing balance support: Bilateral upper extremity supported, During functional activity, Reliant on assistive device for balance Standing balance-Leahy Scale: Fair Standing balance comment: without RW patient is unsteady.                            Communication Communication Communication: Impaired Factors Affecting Communication: Difficulty expressing self  Cognition Arousal: Alert Behavior During Therapy: Flat affect   PT - Cognitive impairments: Difficult to assess, Awareness, Problem solving, Safety/Judgement, No family/caregiver present to determine baseline                         Following commands: Impaired Following commands impaired: Follows one step commands inconsistently, Follows one step commands with increased time  Cueing Cueing Techniques: Verbal cues, Gestural cues, Visual cues  Exercises      General Comments        Pertinent Vitals/Pain Pain Assessment Pain Assessment: No/denies pain    Home Living                          Prior Function            PT Goals (current goals can now be found in the care plan section) Acute Rehab PT  Goals Patient Stated Goal: to figure things out PT Goal Formulation: With patient Time For Goal Achievement: 12/26/23 Potential to Achieve Goals: Good Progress towards PT goals: Progressing toward goals    Frequency    Min 3X/week      PT Plan      Co-evaluation              AM-PAC PT 6 Clicks Mobility   Outcome Measure  Help needed turning from your back to your side while in a flat bed without using bedrails?: A Little Help needed moving from lying on your back to sitting on the side of a flat bed without using bedrails?: A Little Help needed moving to and from a bed to a chair (including a wheelchair)?: A Little Help needed standing up from a chair using your arms (e.g., wheelchair or bedside chair)?: A Little Help needed to walk in hospital room?: A Little Help needed climbing 3-5 steps with a railing? : A Little 6 Click Score: 18    End of Session Equipment Utilized During Treatment: Gait belt Activity Tolerance: Patient tolerated treatment well Patient left: in chair;with call bell/phone within reach;with chair alarm set Nurse Communication: Mobility status PT Visit Diagnosis: Muscle weakness (generalized) (M62.81);Other abnormalities of gait and mobility (R26.89);Unsteadiness on feet (R26.81)     Time: 8499-8465 PT Time Calculation (min) (ACUTE ONLY): 34 min  Charges:    $Gait Training: 8-22 mins $Therapeutic Activity: 8-22 mins PT General Charges $$ ACUTE PT VISIT: 1 Visit                     Galvin Aversa, PT, GCS 12/14/23,3:49 PM

## 2023-12-15 DIAGNOSIS — G459 Transient cerebral ischemic attack, unspecified: Secondary | ICD-10-CM | POA: Diagnosis not present

## 2023-12-15 LAB — BASIC METABOLIC PANEL WITH GFR
Anion gap: 9 (ref 5–15)
BUN: 31 mg/dL — ABNORMAL HIGH (ref 8–23)
CO2: 26 mmol/L (ref 22–32)
Calcium: 9.4 mg/dL (ref 8.9–10.3)
Chloride: 100 mmol/L (ref 98–111)
Creatinine, Ser: 0.9 mg/dL (ref 0.61–1.24)
GFR, Estimated: >60 mL/min (ref >60)
Glucose, Bld: 112 mg/dL — ABNORMAL HIGH (ref 70–99)
Potassium: 3.7 mmol/L (ref 3.5–5.1)
Sodium: 135 mmol/L (ref 135–145)

## 2023-12-15 LAB — CBC WITH DIFFERENTIAL/PLATELET
Abs Immature Granulocytes: 0.02 K/uL (ref 0.00–0.07)
Basophils Absolute: 0 K/uL (ref 0.0–0.1)
Basophils Relative: 0 %
Eosinophils Absolute: 0.1 K/uL (ref 0.0–0.5)
Eosinophils Relative: 1 %
HCT: 44.4 % (ref 39.0–52.0)
Hemoglobin: 16.1 g/dL (ref 13.0–17.0)
Immature Granulocytes: 0 %
Lymphocytes Relative: 33 %
Lymphs Abs: 2.5 K/uL (ref 0.7–4.0)
MCH: 32.5 pg (ref 26.0–34.0)
MCHC: 36.3 g/dL — ABNORMAL HIGH (ref 30.0–36.0)
MCV: 89.5 fL (ref 80.0–100.0)
Monocytes Absolute: 0.8 K/uL (ref 0.1–1.0)
Monocytes Relative: 10 %
Neutro Abs: 4.2 K/uL (ref 1.7–7.7)
Neutrophils Relative %: 56 %
Platelets: 276 K/uL (ref 150–400)
RBC: 4.96 MIL/uL (ref 4.22–5.81)
RDW: 12.9 % (ref 11.5–15.5)
WBC: 7.6 K/uL (ref 4.0–10.5)
nRBC: 0 % (ref 0.0–0.2)

## 2023-12-15 NOTE — Progress Notes (Signed)
 Progress Note   Patient: Scott Yoder. FMW:[id number] DOB: 07/04/1955 DOA: 12/11/2023     4 DOS: the patient was seen and examined on 12/15/2023     Brief hospital course: From HPI Scott Yoder. is a 68 y.o. male with medical history significant of CAD s/p CABG 2015, HTN, HLD , per patient not on statin as was taken of medication, who presents to ED for the third time since Sunday due to lower extremity weakness Left >right as well as mild confusion. Per patient he woke up on Sunday with these symptoms. He notes symptoms are not improving. He notes no new vision changes, no upper extremity weakness,  no chest pain, sob/ fever/chills n/v/d or abdominal pain . Per RN patient has had intermittent episode of slurred speech in ED.      Assessment and Plan: Acute ischemic stroke -slurred speech/ confusion intermittent ,leg weakness - CT head no acute findings but noted old infarcts no prior dx of CVA in the past that patient remembers, neuro exam no mild drift on left Neurologist on board and case discussed Continue aspirin /as well as Plavix  According to neurology Dr. Matthews patient should be discharged on DAPT x90 days f/b plavix  75mg  daily 2/2 severe intracranial stenosis  MRI of the brain showed acute right ICA infarct Continue PT OT PT OT recommending acute rehab placement   Hypertensive Emergency  - Continue blood pressure monitoring Has completed 48 hours of permissive hypertension Some of his home antihypertensives were resumed for gradual BP reduction   Hypokalemia-continue repletion and monitoring   Multiple drug abuse Patient's U tox positive for cannabis and cocaine Counseled on cessation We will monitor closely for withdrawals   HLD -not on statin currently  -note to have mild elevated lfts  Lipid panel shows LDL 66 Patient on statin therapy at home   Mild Elevated LFTs Gallbladder polyp Abdominal ultrasound did not show any cholelith cholelithiasis or  findings of acute cholecystitis. To have follow-up imaging in 2 years   CAD s/p CABG Has completed 48 hours of permissive hypertension Continue aspirin  therapy   DVT prophylaxis: scd Code Status: full/ as discussed per patient wishes in event of cardiac arrest     Disposition Plan: Awaiting placement   Consults called: Neurology   Subjective:  Patient has been cleared by neurologist Currently pending skilled nursing facility/rehab placement He denies nausea vomiting abdominal pain no chest pain   Physical Exam: Eyes: pupils equal, lids and conjunctivae normal ENMT: Mucous membranes are moist. Posterior pharynx clear of any exudate or lesions. Neck: normal, supple, no masses, no thyromegaly Respiratory: clear to auscultation bilaterally, no wheezing, no crackles. Normal respiratory effort. No accessory muscle use.  Cardiovascular: Regular rate and rhythm, no murmurs / rubs / gallops. No extremity edema. 2+ pedal pulses.  Abdomen: no tenderness, no masses palpated. No hepatosplenomegaly. Bowel sounds positive.  Musculoskeletal: no clubbing / cyanosis. No joint deformity upper and lower extremities. Good ROM, no contractures. Normal muscle tone.  Skin: no rashes, lesions, ulcers. No induration Neurologic: CN  grossly intact. Sensation intact, Strength 5/5 in all 4. patient does appear to not be able to hold leg up on left as on right but strength is 5/5 . Holding left leg up for 5 seconds notes drift but does not touch bed.  Psychiatric: Normal judgment and insight. Alert and oriented x 3. Normal mood.        Data Reviewed:   Vitals:   12/15/23 0429 12/15/23 0830 12/15/23 1230 12/15/23  1632  BP: 116/81 (!) 154/110 122/83 (!) 195/117  Pulse: 64 66 61 68  Resp: 17 18 19 16   Temp: 98.2 F (36.8 C) 97.6 F (36.4 C) 97.9 F (36.6 C) (!) 97.3 F (36.3 C)  TempSrc:      SpO2: 97% 100% 100% 97%  Weight:      Height:          Latest Ref Rng & Units 12/15/2023    5:36 AM  12/14/2023    5:02 AM 12/13/2023    4:14 AM  CBC  WBC 4.0 - 10.5 K/uL 7.6  5.3  5.3   Hemoglobin 13.0 - 17.0 g/dL 83.8  83.8  84.8   Hematocrit 39.0 - 52.0 % 44.4  44.8  41.5   Platelets 150 - 400 K/uL 276  261  265        Latest Ref Rng & Units 12/15/2023    5:36 AM 12/14/2023    5:02 AM 12/13/2023    4:14 AM  BMP  Glucose 70 - 99 mg/dL 887  895  892   BUN 8 - 23 mg/dL 31  23  28    Creatinine 0.61 - 1.24 mg/dL 9.09  9.36  9.11   Sodium 135 - 145 mmol/L 135  136  136   Potassium 3.5 - 5.1 mmol/L 3.7  3.1  3.0   Chloride 98 - 111 mmol/L 100  104  101   CO2 22 - 32 mmol/L 26  24  26    Calcium  8.9 - 10.3 mg/dL 9.4  9.3  8.8      Author: Drue ONEIDA Potter, MD 12/15/2023 5:38 PM  For on call review [email or web address].

## 2023-12-15 NOTE — Plan of Care (Signed)
 Pt sleeping; no c/o pain; No changes through the night. Problem: Ischemic Stroke/TIA Tissue Perfusion: Goal: Complications of ischemic stroke/TIA will be minimized 12/15/2023 0329 by Jubal Rademaker J, RN Outcome: Progressing 12/15/2023 0329 by Melven Odilia PARAS, RN Outcome: Progressing   Problem: Coping: Goal: Will verbalize positive feelings about self 12/15/2023 0329 by Melven Odilia PARAS, RN Outcome: Progressing 12/15/2023 0329 by Melven Odilia PARAS, RN Outcome: Progressing   Problem: Self-Care: Goal: Ability to participate in self-care as condition permits will improve 12/15/2023 0329 by Melven Odilia PARAS, RN Outcome: Progressing 12/15/2023 0329 by Melven Odilia PARAS, RN Outcome: Progressing   Problem: Health Behavior/Discharge Planning: Goal: Goals will be collaboratively established with patient/family 12/15/2023 0329 by Melven Odilia PARAS, RN Outcome: Progressing 12/15/2023 0329 by Melven Odilia PARAS, RN Outcome: Progressing

## 2023-12-16 DIAGNOSIS — G459 Transient cerebral ischemic attack, unspecified: Secondary | ICD-10-CM | POA: Diagnosis not present

## 2023-12-16 LAB — BASIC METABOLIC PANEL WITH GFR
Anion gap: 13 (ref 5–15)
BUN: 27 mg/dL — ABNORMAL HIGH (ref 8–23)
CO2: 25 mmol/L (ref 22–32)
Calcium: 9.4 mg/dL (ref 8.9–10.3)
Chloride: 97 mmol/L — ABNORMAL LOW (ref 98–111)
Creatinine, Ser: 0.97 mg/dL (ref 0.61–1.24)
GFR, Estimated: >60 mL/min (ref >60)
Glucose, Bld: 109 mg/dL — ABNORMAL HIGH (ref 70–99)
Potassium: 3.5 mmol/L (ref 3.5–5.1)
Sodium: 135 mmol/L (ref 135–145)

## 2023-12-16 LAB — CBC WITH DIFFERENTIAL/PLATELET
Abs Immature Granulocytes: 0.02 K/uL (ref 0.00–0.07)
Basophils Absolute: 0 K/uL (ref 0.0–0.1)
Basophils Relative: 1 %
Eosinophils Absolute: 0.1 K/uL (ref 0.0–0.5)
Eosinophils Relative: 1 %
HCT: 43.4 % (ref 39.0–52.0)
Hemoglobin: 16.2 g/dL (ref 13.0–17.0)
Immature Granulocytes: 0 %
Lymphocytes Relative: 33 %
Lymphs Abs: 2 K/uL (ref 0.7–4.0)
MCH: 32.9 pg (ref 26.0–34.0)
MCHC: 37.3 g/dL — ABNORMAL HIGH (ref 30.0–36.0)
MCV: 88.2 fL (ref 80.0–100.0)
Monocytes Absolute: 0.7 K/uL (ref 0.1–1.0)
Monocytes Relative: 11 %
Neutro Abs: 3.3 K/uL (ref 1.7–7.7)
Neutrophils Relative %: 54 %
Platelets: 273 K/uL (ref 150–400)
RBC: 4.92 MIL/uL (ref 4.22–5.81)
RDW: 12.8 % (ref 11.5–15.5)
WBC: 6.1 K/uL (ref 4.0–10.5)
nRBC: 0 % (ref 0.0–0.2)

## 2023-12-16 MED ORDER — LACTULOSE 10 GM/15ML PO SOLN
30.0000 g | Freq: Three times a day (TID) | ORAL | Status: DC
  Administered 2023-12-16 – 2023-12-18 (×8): 30 g via ORAL
  Filled 2023-12-16 (×8): qty 60

## 2023-12-16 MED ORDER — POLYETHYLENE GLYCOL 3350 17 G PO PACK
17.0000 g | PACK | Freq: Every day | ORAL | Status: DC
  Administered 2023-12-16 – 2023-12-18 (×3): 17 g via ORAL
  Filled 2023-12-16 (×3): qty 1

## 2023-12-16 NOTE — NC FL2 (Signed)
 [place]  MEDICAID [id number] LEVEL OF CARE FORM     IDENTIFICATION  Patient Name: Scott Yoder. Birthdate: 1956-02-05 Sex: male Admission Date (Current Location): 12/11/2023  [place] and [place] Number:  Chiropodist and Address:  [facility], [address] [place], [place] [address]      Provider Number: [id number]  Attending Physician Name and Address:  Dorinda Drue DASEN, MD  Relative Name and Phone Number:  Rojelio Geralene    Current Level of Care: [facility]) Recommended Level of Care: Skilled Nursing Facility Prior Approval Number:    Date Approved/Denied:   PASRR Number: pending  Discharge Plan: SNF    Current Diagnoses: Patient Active Problem List   Diagnosis Date Noted   TIA (transient ischemic attack) 12/11/2023   Atherosclerosis of coronary artery 07/31/2014   Diabetes (HCC) 07/31/2014   BP (high blood pressure) 07/31/2014   Coronary artery disease    Hernia, inguinal 07/14/2014    Orientation RESPIRATION BLADDER Height & Weight     Self, Situation      Weight: 123 lb 3.8 oz (55.9 kg) Height:  5' 10 (177.8 cm)  BEHAVIORAL SYMPTOMS/MOOD NEUROLOGICAL BOWEL NUTRITION STATUS           AMBULATORY STATUS COMMUNICATION OF NEEDS Skin   Supervision   Normal                       Personal Care Assistance Level of Assistance  Bathing, Feeding Bathing Assistance: Limited assistance Feeding assistance: Limited assistance       Functional Limitations Info  Speech     Speech Info: Impaired    SPECIAL CARE FACTORS FREQUENCY  PT (By licensed PT), OT (By licensed OT)     PT Frequency: 5x a week OT Frequency: 5x a week            Contractures      Additional Factors Info                  Current Medications (12/16/2023):  This is the current hospital active medication list Current Facility-Administered Medications  Medication Dose Route Frequency Provider Last Rate  Last Admin   acetaminophen  (TYLENOL ) tablet 650 mg  650 mg Oral Q4H PRN Thomas, Sara-Maiz A, MD   650 mg at 12/14/23 1535   Or   acetaminophen  (TYLENOL ) 160 MG/5ML solution 650 mg  650 mg Per Tube Q4H PRN Debby Camila LABOR, MD       Or   acetaminophen  (TYLENOL ) suppository 650 mg  650 mg Rectal Q4H PRN Debby Camila LABOR, MD       amLODipine  (NORVASC ) tablet 10 mg  10 mg Oral Daily Djan, Prince T, MD   10 mg at 12/16/23 9163   aspirin  chewable tablet 81 mg  81 mg Oral Daily Thomas, Sara-Maiz A, MD   81 mg at 12/16/23 0836   atorvastatin  (LIPITOR) tablet 40 mg  40 mg Oral Daily Djan, Prince T, MD   40 mg at 12/16/23 0836   brimonidine  (ALPHAGAN ) 0.15 % ophthalmic solution 1 drop  1 drop Both Eyes TID Dorinda Drue DASEN, MD   1 drop at 12/16/23 0839   Chlorhexidine  Gluconate Cloth 2 % PADS 6 each  6 each Topical Daily Debby Camila LABOR, MD   6 each at 12/14/23 1013   clopidogrel  (PLAVIX ) tablet 75 mg  75 mg Oral Daily Stack, Colleen M, MD   75 mg at 12/16/23 [id number]  escitalopram  (LEXAPRO ) tablet 10 mg  10 mg Oral Daily Djan, Prince T, MD   10 mg at 12/16/23 0836   hydrochlorothiazide  (HYDRODIURIL ) tablet 25 mg  25 mg Oral Daily Djan, Prince T, MD   25 mg at 12/16/23 0837   labetalol  (NORMODYNE ) injection 20 mg  20 mg Intravenous Q2H PRN Thomas, Sara-Maiz A, MD   20 mg at 12/13/23 1628   lactulose  (CHRONULAC ) 10 GM/15ML solution 30 g  30 g Oral TID Dorinda Homans T, MD   30 g at 12/16/23 0842   lisinopril  (ZESTRIL ) tablet 20 mg  20 mg Oral Daily Djan, Prince T, MD   20 mg at 12/16/23 0836   ondansetron  (ZOFRAN ) injection 4 mg  4 mg Intravenous Q6H PRN Dorinda Homans DASEN, MD   4 mg at 12/14/23 1808   Oral care mouth rinse  15 mL Mouth Rinse PRN Debby Camila LABOR, MD       oxyCODONE  (Oxy IR/ROXICODONE ) immediate release tablet 10 mg  10 mg Oral Q6H PRN Dorinda Homans DASEN, MD   10 mg at 12/16/23 0729   polyethylene glycol (MIRALAX  / GLYCOLAX ) packet 17 g  17 g Oral Daily Djan, Prince T, MD   17 g at 12/16/23  9157   senna-docusate (Senokot-S) tablet 1 tablet  1 tablet Oral QHS PRN Debby Camila LABOR, MD   1 tablet at 12/15/23 2048     Discharge Medications: Please see discharge summary for a list of discharge medications.  Relevant Imaging Results:  Relevant Lab Results:   Additional Information    Alfonso Rummer, LCSW

## 2023-12-16 NOTE — Progress Notes (Signed)
 Occupational Therapy Treatment Patient Details Name: Scott Yoder. MRN: [id number] DOB: [birth year] Today's Date: 12/16/2023   History of present illness 68 y/o male presented to ED on 12/11/23 for lower extremity weakness, mild confusion, and elevated BP. MRI showed acute R ACA infarct. PMH: HTN, CAD s/p CABG 2015, HLD   OT comments  Pt is supine in bed on arrival. Pleasant and agreeable to OT session. He denies pain. Pt performed bed mobility from supine position with supervision and cues for initiation. Pt required CGA for LB dressing to don shorts seated and standing. Utilized urinal in standing with no UE support with CGA to maintain balance. Able to stand from EOB x2 trials with CGA to RW with cues for hand/feet placement and forward scoot. Pt ambulated ~20-25 ft using RW with Min A for safety and notable LLE apraxia and increased cues to maintain RW proximity. Pt returned to recliner with all needs in place and will cont to require skilled acute OT services to maximize his safety and IND to return to PLOF.       If plan is discharge home, recommend the following:  A little help with walking and/or transfers;A little help with bathing/dressing/bathroom;Assist for transportation;Supervision due to cognitive status;Direct supervision/assist for medications management   Equipment Recommendations  BSC/3in1    Recommendations for Other Services      Precautions / Restrictions Precautions Precautions: Fall Recall of Precautions/Restrictions: Impaired Precaution/Restrictions Comments: watch BP Restrictions Weight Bearing Restrictions Per Provider Order: No       Mobility Bed Mobility Overal bed mobility: Needs Assistance Bed Mobility: Supine to Sit     Supine to sit: Supervision     General bed mobility comments: increased time for initiation needing cues to perform, but no physical assist from flat supine position    Transfers Overall transfer level: Needs  assistance Equipment used: Rolling walker (2 wheels) Transfers: Sit to/from Stand Sit to Stand: Contact guard assist           General transfer comment: with cues for hand/feet placement pt able to stand from EOB x2 trials to RW with CGA for safety; increased difficulty with LLE advancement, increased time but able to ambulate to door and back to recliner using RW with Min A/cues for continuation of task at times     Balance Overall balance assessment: Needs assistance Sitting-balance support: Feet supported Sitting balance-Leahy Scale: Good     Standing balance support: During functional activity, No upper extremity supported Standing balance-Leahy Scale: Fair Standing balance comment: able to utilize urinal standing at bedside with no UE support and CGA from therapist; utilized RW for ambulation with BUE support                           ADL either performed or assessed with clinical judgement   ADL Overall ADL's : Needs assistance/impaired                     Lower Body Dressing: Contact guard assist;Sit to/from stand Lower Body Dressing Details (indicate cue type and reason): to don shorts     Toileting- Clothing Manipulation and Hygiene: Supervision/safety;Sit to/from stand;Contact guard assist Toileting - Clothing Manipulation Details (indicate cue type and reason): to utilize urinal with no UE support and CGA/SBA from therapist     Functional mobility during ADLs: Minimal assistance;Rolling walker (2 wheels)      Extremity/Trunk Assessment  Vision       Perception     Praxis     Communication Communication Communication: Impaired Factors Affecting Communication: Difficulty expressing self   Cognition Arousal: Alert Behavior During Therapy: Flat affect Cognition: Cognition impaired     Awareness: Online awareness impaired Memory impairment (select all impairments): Short-term memory, Working [employer] fast streamer, Armed forces logistics/support/administrative officer functioning impairment (select all impairments): Organization, Reasoning, Problem solving                   Following commands: Impaired Following commands impaired: Follows one step commands inconsistently, Follows one step commands with increased time      Cueing   Cueing Techniques: Verbal cues, Gestural cues, Visual cues  Exercises      Shoulder Instructions       General Comments      Pertinent Vitals/ Pain       Pain Assessment Pain Assessment: No/denies pain  Home Living                                          Prior Functioning/Environment              Frequency  Min 2X/week        Progress Toward Goals  OT Goals(current goals can now be found in the care plan section)  Progress towards OT goals: Progressing toward goals  Acute Rehab OT Goals Patient Stated Goal: improve function OT Goal Formulation: With patient Time For Goal Achievement: 12/26/23 Potential to Achieve Goals: Good  Plan      Co-evaluation                 AM-PAC OT 6 Clicks Daily Activity     Outcome Measure   Help from another person eating meals?: None Help from another person taking care of personal grooming?: None Help from another person toileting, which includes using toliet, bedpan, or urinal?: A Little Help from another person bathing (including washing, rinsing, drying)?: A Little Help from another person to put on and taking off regular upper body clothing?: None Help from another person to put on and taking off regular lower body clothing?: A Little 6 Click Score: 21    End of Session Equipment Utilized During Treatment: Rolling walker (2 wheels);Gait belt  OT Visit Diagnosis: Other abnormalities of gait and mobility (R26.89);Muscle weakness (generalized) (M62.81)   Activity Tolerance Patient tolerated treatment well   Patient Left with call bell/phone within reach;in chair;with chair alarm set    Nurse Communication Mobility status        Time: 8374-8349 OT Time Calculation (min): 25 min  Charges: OT General Charges $OT Visit: 1 Visit OT Treatments $Self Care/Home Management : 8-22 mins $Therapeutic Activity: 8-22 mins  Detron Carras, OTR/L  12/16/23, 5:11 PM   Jamarri Vuncannon E Taliya Mcclard 12/16/2023, 5:08 PM

## 2023-12-16 NOTE — Progress Notes (Signed)
 Physical Therapy Treatment Patient Details Name: Scott Yoder. MRN: [id number] DOB: Sep 03, 1955 Today's Date: 12/16/2023   History of Present Illness 68 y/o male presented to ED on 12/11/23 for lower extremity weakness, mild confusion, and elevated BP. MRI showed acute R ACA infarct. PMH: HTN, CAD s/p CABG 2015, HLD    PT Comments  Pt demonstrates having more apraxic movement in LLE during all mobility task then last PT session 8/16  (see 12/14/23 PT note).  Initial seated BP 168/115, notified RN. Attempted short ambulation with RW (Min A), pt took a few steps, requiring facilitation of LE to advance to step forward. Pt reported feeling hot. Stepped back to bed. Pt reported hot feeling resolved after a 2 min. checked seated BP 156/106. Pt willing and able to perform step-pivot tranfers to recliner, Min A with RW.  Continue to recommend post acute rehab <3 hours therapy/day upon d/c.     If plan is discharge home, recommend the following: A little help with walking and/or transfers;A little help with bathing/dressing/bathroom;Supervision due to cognitive status;Direct supervision/assist for medications management;Assistance with cooking/housework;Help with stairs or ramp for entrance   Can travel by private vehicle     Yes  Equipment Recommendations  Rolling walker (2 wheels)    Recommendations for Other Services Rehab consult     Precautions / Restrictions Precautions Precautions: Fall Recall of Precautions/Restrictions: Impaired Precaution/Restrictions Comments: watch BP Restrictions Weight Bearing Restrictions Per Provider Order: No     Mobility  Bed Mobility Overal bed mobility: Needs Assistance Bed Mobility: Supine to Sit     Supine to sit: Contact guard Sit to supine: Min assist   General bed mobility comments: Pt had difficulty initiating movement and seems to have trouble sequencing movement for LLE for sit<>supine    Transfers Overall transfer level: Needs  assistance Equipment used: Rolling walker (2 wheels) Transfers: Sit to/from Stand, Bed to chair/wheelchair/BSC Sit to Stand: Min assist   Step pivot transfers: Min assist       General transfer comment: Pt required facilitation to initiate movement to advance LLE to take steps, demonstrates apraxic movement pattern.    Ambulation/Gait Ambulation/Gait assistance: Contact guard assist Gait Distance (Feet): 4 Feet (x3) Assistive device: Rolling walker (2 wheels) Gait Pattern/deviations: Decreased step length - right, Decreased step length - left, Decreased stride length, Step-to pattern, Narrow base of support Gait velocity: decr     General Gait Details: Initial seated BP 168/115, notified RN.  Attempted short ambulation with RW (Min A), pt took a few steps, requiring facilitation of LE to advance to step forward.  Pt reported feeling hot.  Stepped back to bed.  Pt reported feeling resolved after a 2 min. checked seated BP 156/106.  Pt willing and able to perform step-pivot tranfers to recliner, Min A with RW.   Stairs             Wheelchair Mobility     Tilt Bed    Modified Rankin (Stroke Patients Only)       Balance Overall balance assessment: Needs assistance Sitting-balance support: Feet supported Sitting balance-Leahy Scale: Good     Standing balance support: Bilateral upper extremity supported, During functional activity, Reliant on assistive device for balance Standing balance-Leahy Scale: Fair Standing balance comment: without RW patient is unsteady.                            Communication Communication Communication: Impaired Factors Affecting Communication: Difficulty expressing  self  Cognition Arousal: Lethargic Behavior During Therapy: Flat affect   PT - Cognitive impairments: Difficult to assess, Awareness, Problem solving, Safety/Judgement, No family/caregiver present to determine baseline                          Following commands: Impaired Following commands impaired: Follows one step commands inconsistently, Follows one step commands with increased time    Cueing Cueing Techniques: Verbal cues, Gestural cues, Visual cues  Exercises      General Comments        Pertinent Vitals/Pain Pain Assessment Pain Assessment: No/denies pain    Home Living                          Prior Function            PT Goals (current goals can now be found in the care plan section) Acute Rehab PT Goals Patient Stated Goal: to figure things out PT Goal Formulation: With patient Time For Goal Achievement: 12/26/23 Progress towards PT goals: PT to reassess next treatment    Frequency    Min 3X/week      PT Plan      Co-evaluation              AM-PAC PT 6 Clicks Mobility   Outcome Measure  Help needed turning from your back to your side while in a flat bed without using bedrails?: A Little Help needed moving from lying on your back to sitting on the side of a flat bed without using bedrails?: A Lot Help needed moving to and from a bed to a chair (including a wheelchair)?: A Little Help needed standing up from a chair using your arms (e.g., wheelchair or bedside chair)?: A Little Help needed to walk in hospital room?: A Lot Help needed climbing 3-5 steps with a railing? : A Lot 6 Click Score: 15    End of Session Equipment Utilized During Treatment: Gait belt Activity Tolerance: Treatment limited secondary to medical complications (Comment);Other (comment) (Pt became symptomatic with HIgh BP with standing activties.) Patient left: in chair;with call bell/phone within reach;with chair alarm set Nurse Communication: Mobility status PT Visit Diagnosis: Muscle weakness (generalized) (M62.81);Other abnormalities of gait and mobility (R26.89);Unsteadiness on feet (R26.81)     Time: 9146-9081 PT Time Calculation (min) (ACUTE ONLY): 25 min  Charges:    $Therapeutic  Activity: 23-37 mins PT General Charges $$ ACUTE PT VISIT: 1 Visit                     Harland Irving, PTA  12/16/23, 9:51 AM

## 2023-12-16 NOTE — Progress Notes (Signed)
 Progress Note   Patient: Scott Yoder. FMW:[id number] DOB: Jul 11, 1955 DOA: 12/11/2023     5 DOS: the patient was seen and examined on 12/16/2023      Brief hospital course: From HPI Scott Yoder. is a 68 y.o. male with medical history significant of CAD s/p CABG 2015, HTN, HLD , per patient not on statin as was taken of medication, who presents to ED for the third time since Sunday due to lower extremity weakness Left >right as well as mild confusion. Per patient he woke up on Sunday with these symptoms. He notes symptoms are not improving. He notes no new vision changes, no upper extremity weakness,  no chest pain, sob/ fever/chills n/v/d or abdominal pain . Per RN patient has had intermittent episode of slurred speech in ED.      Assessment and Plan: Acute ischemic stroke -slurred speech/ confusion intermittent ,leg weakness - CT head no acute findings but noted old infarcts no prior dx of CVA in the past that patient remembers, neuro exam no mild drift on left Neurologist on board and case discussed Continue aspirin /as well as Plavix  According to neurology Dr. Matthews patient should be discharged on DAPT x90 days f/b plavix  75mg  daily 2/2 severe intracranial stenosis  MRI of the brain showed acute right ICA infarct Continue PT OT PT OT recommending acute rehab placement   Hypertensive Emergency  - Continue blood pressure monitoring Has completed 48 hours of permissive hypertension Some of his home antihypertensives were resumed for gradual BP reduction   Hypokalemia-continue repletion and monitoring   Multiple drug abuse Patient's U tox positive for cannabis and cocaine Counseled on cessation We will monitor closely for withdrawals   HLD -not on statin currently  -note to have mild elevated lfts  Lipid panel shows LDL 66 Patient on statin therapy at home   Mild Elevated LFTs Gallbladder polyp Abdominal ultrasound did not show any cholelith cholelithiasis  or findings of acute cholecystitis. To have follow-up imaging in 2 years   CAD s/p CABG Has completed 48 hours of permissive hypertension Continue aspirin  therapy   DVT prophylaxis: scd Code Status: full/ as discussed per patient wishes in event of cardiac arrest     Disposition Plan: Awaiting placement   Consults called: Neurology   Subjective:  Patient awaiting discharge to rehab/SNF He denies any acute complaints    Physical Exam: Eyes: pupils equal, lids and conjunctivae normal ENMT: Mucous membranes are moist. Posterior pharynx clear of any exudate or lesions. Neck: normal, supple, no masses, no thyromegaly Respiratory: clear to auscultation bilaterally, no wheezing, no crackles. Normal respiratory effort. No accessory muscle use.  Cardiovascular: Regular rate and rhythm, no murmurs / rubs / gallops. No extremity edema. 2+ pedal pulses.  Abdomen: no tenderness, no masses palpated. No hepatosplenomegaly. Bowel sounds positive.  Musculoskeletal: no clubbing / cyanosis. No joint deformity upper and lower extremities. Good ROM, no contractures. Normal muscle tone.  Skin: no rashes, lesions, ulcers. No induration Neurologic: CN  grossly intact. Sensation intact, Strength 5/5 in all 4. patient does appear to not be able to hold leg up on left as on right but strength is 5/5 . Holding left leg up for 5 seconds notes drift but does not touch bed.  Psychiatric: Normal judgment and insight. Alert and oriented x 3. Normal mood.        Data Reviewed:     Vitals:   12/16/23 0828 12/16/23 1230 12/16/23 1703 12/16/23 1729  BP: (!) 162/106 (!) 182/115 ROLLEN)  168/106 (!) 151/95  Pulse: 73 73  69  Resp: 18 19    Temp:      TempSrc:      SpO2: 100% 100%  100%  Weight:      Height:          Latest Ref Rng & Units 12/16/2023    5:11 AM 12/15/2023    5:36 AM 12/14/2023    5:02 AM  BMP  Glucose 70 - 99 mg/dL 890  887  895   BUN 8 - 23 mg/dL 27  31  23    Creatinine 0.61 - 1.24 mg/dL  9.02  9.09  9.36   Sodium 135 - 145 mmol/L 135  135  136   Potassium 3.5 - 5.1 mmol/L 3.5  3.7  3.1   Chloride 98 - 111 mmol/L 97  100  104   CO2 22 - 32 mmol/L 25  26  24    Calcium  8.9 - 10.3 mg/dL 9.4  9.4  9.3      Author: Drue ONEIDA Potter, MD 12/16/2023 5:55 PM  For on call review [email or web address].

## 2023-12-17 DIAGNOSIS — G459 Transient cerebral ischemic attack, unspecified: Secondary | ICD-10-CM | POA: Diagnosis not present

## 2023-12-17 LAB — CBC WITH DIFFERENTIAL/PLATELET
Abs Immature Granulocytes: 0.02 K/uL (ref 0.00–0.07)
Basophils Absolute: 0 K/uL (ref 0.0–0.1)
Basophils Relative: 1 %
Eosinophils Absolute: 0.1 K/uL (ref 0.0–0.5)
Eosinophils Relative: 1 %
HCT: 44.5 % (ref 39.0–52.0)
Hemoglobin: 16.5 g/dL (ref 13.0–17.0)
Immature Granulocytes: 0 %
Lymphocytes Relative: 37 %
Lymphs Abs: 2.2 K/uL (ref 0.7–4.0)
MCH: 32.7 pg (ref 26.0–34.0)
MCHC: 37.1 g/dL — ABNORMAL HIGH (ref 30.0–36.0)
MCV: 88.1 fL (ref 80.0–100.0)
Monocytes Absolute: 0.7 K/uL (ref 0.1–1.0)
Monocytes Relative: 11 %
Neutro Abs: 2.9 K/uL (ref 1.7–7.7)
Neutrophils Relative %: 50 %
Platelets: 291 K/uL (ref 150–400)
RBC: 5.05 MIL/uL (ref 4.22–5.81)
RDW: 12.9 % (ref 11.5–15.5)
WBC: 5.9 K/uL (ref 4.0–10.5)
nRBC: 0 % (ref 0.0–0.2)

## 2023-12-17 LAB — BASIC METABOLIC PANEL WITH GFR
Anion gap: 10 (ref 5–15)
BUN: 26 mg/dL — ABNORMAL HIGH (ref 8–23)
CO2: 29 mmol/L (ref 22–32)
Calcium: 9.3 mg/dL (ref 8.9–10.3)
Chloride: 94 mmol/L — ABNORMAL LOW (ref 98–111)
Creatinine, Ser: 0.79 mg/dL (ref 0.61–1.24)
GFR, Estimated: >60 mL/min (ref >60)
Glucose, Bld: 108 mg/dL — ABNORMAL HIGH (ref 70–99)
Potassium: 3.4 mmol/L — ABNORMAL LOW (ref 3.5–5.1)
Sodium: 133 mmol/L — ABNORMAL LOW (ref 135–145)

## 2023-12-17 NOTE — Progress Notes (Signed)
 NIHSS q4 hr d/ced as MD order and new order NIHSS q shift in place as MD order

## 2023-12-17 NOTE — Plan of Care (Signed)
  Problem: Education: Goal: Knowledge of disease or condition will improve Outcome: Progressing   Problem: Coping: Goal: Will identify appropriate support needs Outcome: Progressing   Problem: Health Behavior/Discharge Planning: Goal: Ability to manage health-related needs will improve Outcome: Progressing Goal: Goals will be collaboratively established with patient/family Outcome: Progressing   Problem: Self-Care: Goal: Ability to participate in self-care as condition permits will improve Outcome: Progressing Goal: Verbalization of feelings and concerns over difficulty with self-care will improve Outcome: Progressing Goal: Ability to communicate needs accurately will improve Outcome: Progressing   Problem: Nutrition: Goal: Risk of aspiration will decrease Outcome: Progressing

## 2023-12-17 NOTE — Plan of Care (Signed)

## 2023-12-17 NOTE — Plan of Care (Signed)
  Problem: Education: Goal: Knowledge of disease or condition will improve 12/17/2023 1734 by Clois Bridgette BIRCH, RN Outcome: Progressing 12/17/2023 1657 by Clois Bridgette BIRCH, RN Outcome: Progressing   Problem: Ischemic Stroke/TIA Tissue Perfusion: Goal: Complications of ischemic stroke/TIA will be minimized Outcome: Progressing   Problem: Coping: Goal: Will verbalize positive feelings about self Outcome: Progressing Goal: Will identify appropriate support needs 12/17/2023 1734 by Clois Bridgette BIRCH, RN Outcome: Progressing 12/17/2023 1657 by Clois Bridgette BIRCH, RN Outcome: Progressing   Problem: Health Behavior/Discharge Planning: Goal: Ability to manage health-related needs will improve 12/17/2023 1734 by Clois Bridgette BIRCH, RN Outcome: Progressing 12/17/2023 1657 by Clois Bridgette BIRCH, RN Outcome: Progressing Goal: Goals will be collaboratively established with patient/family 12/17/2023 1734 by Clois Bridgette BIRCH, RN Outcome: Progressing 12/17/2023 1657 by Clois Bridgette D, RN Outcome: Progressing   Problem: Self-Care: Goal: Ability to participate in self-care as condition permits will improve 12/17/2023 1734 by Clois Bridgette BIRCH, RN Outcome: Progressing 12/17/2023 1657 by Clois Bridgette BIRCH, RN Outcome: Progressing Goal: Verbalization of feelings and concerns over difficulty with self-care will improve 12/17/2023 1734 by Clois Bridgette BIRCH, RN Outcome: Progressing 12/17/2023 1657 by Clois Bridgette BIRCH, RN Outcome: Progressing Goal: Ability to communicate needs accurately will improve 12/17/2023 1734 by Clois Bridgette BIRCH, RN Outcome: Progressing 12/17/2023 1657 by Clois Bridgette BIRCH, RN Outcome: Progressing   Problem: Nutrition: Goal: Risk of aspiration will decrease 12/17/2023 1734 by Clois Bridgette BIRCH, RN Outcome: Progressing 12/17/2023 1657 by Clois Bridgette BIRCH, RN Outcome: Progressing   Problem: Health Behavior/Discharge Planning: Goal: Ability  to manage health-related needs will improve Outcome: Progressing   Problem: Clinical Measurements: Goal: Will remain free from infection Outcome: Progressing   Problem: Activity: Goal: Risk for activity intolerance will decrease Outcome: Progressing   Problem: Nutrition: Goal: Adequate nutrition will be maintained Outcome: Progressing   Problem: Coping: Goal: Level of anxiety will decrease Outcome: Progressing   Problem: Elimination: Goal: Will not experience complications related to bowel motility Outcome: Progressing Goal: Will not experience complications related to urinary retention Outcome: Progressing   Problem: Pain Managment: Goal: General experience of comfort will improve and/or be controlled Outcome: Progressing   Problem: Safety: Goal: Ability to remain free from injury will improve Outcome: Progressing   Problem: Skin Integrity: Goal: Risk for impaired skin integrity will decrease Outcome: Progressing

## 2023-12-17 NOTE — Progress Notes (Signed)
 Progress Note   Patient: Scott Yoder. FMW:[id number] DOB: 03-30-1956 DOA: 12/11/2023     6 DOS: the patient was seen and examined on 12/17/2023     Brief hospital course: From HPI Vu Decorian Schuenemann. is a 68 y.o. male with medical history significant of CAD s/p CABG 2015, HTN, HLD , per patient not on statin as was taken of medication, who presents to ED for the third time since Sunday due to lower extremity weakness Left >right as well as mild confusion. Per patient he woke up on Sunday with these symptoms. He notes symptoms are not improving. He notes no new vision changes, no upper extremity weakness,  no chest pain, sob/ fever/chills n/v/d or abdominal pain . Per RN patient has had intermittent episode of slurred speech in ED.      Assessment and Plan: Acute ischemic stroke -slurred speech/ confusion intermittent ,leg weakness - CT head no acute findings but noted old infarcts no prior dx of CVA in the past that patient remembers, neuro exam no mild drift on left Continue aspirin /as well as Plavix  According to neurology Dr. Matthews patient should be discharged on DAPT x90 days f/b plavix  75mg  daily 2/2 severe intracranial stenosis  MRI of the brain showed acute right ICA infarct Continue PT OT PT OT recommending acute rehab placement   Hypertensive Emergency  - Continue blood pressure monitoring Continue current antihypertensives   Hypokalemia-continue repletion and monitoring   Multiple drug abuse Patient's U tox positive for cannabis and cocaine Counseled on cessation We will monitor closely for withdrawals   HLD -not on statin currently  -note to have mild elevated lfts  Lipid panel shows LDL 66 Patient on statin therapy at home   Mild Elevated LFTs Gallbladder polyp Abdominal ultrasound did not show any cholelithiasis or findings of acute cholecystitis. To have follow-up imaging in 2 years   CAD s/p CABG Has completed 48 hours of permissive  hypertension Continue aspirin  therapy   DVT prophylaxis: scd Code Status: full/ as discussed per patient wishes in event of cardiac arrest     Disposition Plan: Awaiting placement   Consults called: Neurology   Subjective:  Patient awaiting discharge to rehab/SNF Denies any acute overnight events   Physical Exam: Eyes: pupils equal, lids and conjunctivae normal ENMT: Mucous membranes are moist. Posterior pharynx clear of any exudate or lesions. Neck: normal, supple, no masses, no thyromegaly Respiratory: clear to auscultation bilaterally, no wheezing, no crackles. Normal respiratory effort. No accessory muscle use.  Cardiovascular: Regular rate and rhythm, no murmurs / rubs / gallops. No extremity edema. 2+ pedal pulses.  Abdomen: no tenderness, no masses palpated. No hepatosplenomegaly. Bowel sounds positive.  Musculoskeletal: no clubbing / cyanosis. No joint deformity upper and lower extremities. Good ROM, no contractures. Normal muscle tone.  Skin: no rashes, lesions, ulcers. No induration Neurologic: CN  grossly intact. Sensation intact, Strength 5/5 in all 4. patient does appear to not be able to hold leg up on left as on right but strength is 5/5 . Holding left leg up for 5 seconds notes drift but does not touch bed.  Psychiatric: Normal judgment and insight. Alert and oriented x 3. Normal mood.        Data Reviewed:  Vitals:   12/17/23 0438 12/17/23 0838 12/17/23 1238 12/17/23 1438  BP: (!) 151/85 (!) 173/95 (!) 189/111 (!) 140/93  Pulse: 66 67 84 76  Resp: 20 20 18 19   Temp: 97.7 F (36.5 C) 97.7 F (36.5 C) 98  F (36.7 C) 98 F (36.7 C)  TempSrc: Oral Oral Oral Oral  SpO2: 100% 100% 96% 96%  Weight:      Height:          Latest Ref Rng & Units 12/17/2023    4:24 AM 12/16/2023    5:11 AM 12/15/2023    5:36 AM  CBC  WBC 4.0 - 10.5 K/uL 5.9  6.1  7.6   Hemoglobin 13.0 - 17.0 g/dL 83.4  83.7  83.8   Hematocrit 39.0 - 52.0 % 44.5  43.4  44.4   Platelets 150 -  400 K/uL 291  273  276        Latest Ref Rng & Units 12/17/2023    4:24 AM 12/16/2023    5:11 AM 12/15/2023    5:36 AM  BMP  Glucose 70 - 99 mg/dL 891  890  887   BUN 8 - 23 mg/dL 26  27  31    Creatinine 0.61 - 1.24 mg/dL 9.20  9.02  9.09   Sodium 135 - 145 mmol/L 133  135  135   Potassium 3.5 - 5.1 mmol/L 3.4  3.5  3.7   Chloride 98 - 111 mmol/L 94  97  100   CO2 22 - 32 mmol/L 29  25  26    Calcium  8.9 - 10.3 mg/dL 9.3  9.4  9.4      Author: Drue ONEIDA Potter, MD 12/17/2023 6:32 PM  For on call review [email or web address].

## 2023-12-18 DIAGNOSIS — G459 Transient cerebral ischemic attack, unspecified: Secondary | ICD-10-CM | POA: Diagnosis not present

## 2023-12-18 LAB — BASIC METABOLIC PANEL WITH GFR
Anion gap: 12 (ref 5–15)
BUN: 23 mg/dL (ref 8–23)
CO2: 27 mmol/L (ref 22–32)
Calcium: 9.4 mg/dL (ref 8.9–10.3)
Chloride: 91 mmol/L — ABNORMAL LOW (ref 98–111)
Creatinine, Ser: 0.77 mg/dL (ref 0.61–1.24)
GFR, Estimated: >60 mL/min (ref >60)
Glucose, Bld: 113 mg/dL — ABNORMAL HIGH (ref 70–99)
Potassium: 3.3 mmol/L — ABNORMAL LOW (ref 3.5–5.1)
Sodium: 130 mmol/L — ABNORMAL LOW (ref 135–145)

## 2023-12-18 LAB — CBC WITH DIFFERENTIAL/PLATELET
Abs Immature Granulocytes: 0.02 K/uL (ref 0.00–0.07)
Basophils Absolute: 0 K/uL (ref 0.0–0.1)
Basophils Relative: 0 %
Eosinophils Absolute: 0.2 K/uL (ref 0.0–0.5)
Eosinophils Relative: 2 %
HCT: 46.4 % (ref 39.0–52.0)
Hemoglobin: 17 g/dL (ref 13.0–17.0)
Immature Granulocytes: 0 %
Lymphocytes Relative: 36 %
Lymphs Abs: 2.5 K/uL (ref 0.7–4.0)
MCH: 32.6 pg (ref 26.0–34.0)
MCHC: 36.6 g/dL — ABNORMAL HIGH (ref 30.0–36.0)
MCV: 88.9 fL (ref 80.0–100.0)
Monocytes Absolute: 0.8 K/uL (ref 0.1–1.0)
Monocytes Relative: 12 %
Neutro Abs: 3.4 K/uL (ref 1.7–7.7)
Neutrophils Relative %: 50 %
Platelets: 324 K/uL (ref 150–400)
RBC: 5.22 MIL/uL (ref 4.22–5.81)
RDW: 12.1 % (ref 11.5–15.5)
WBC: 7 K/uL (ref 4.0–10.5)
nRBC: 0 % (ref 0.0–0.2)

## 2023-12-18 LAB — GLUCOSE, CAPILLARY: Glucose-Capillary: 138 mg/dL — ABNORMAL HIGH (ref 70–99)

## 2023-12-18 MED ORDER — CARVEDILOL 25 MG PO TABS
50.0000 mg | ORAL_TABLET | Freq: Two times a day (BID) | ORAL | Status: DC
  Administered 2023-12-18: 50 mg via ORAL
  Filled 2023-12-18: qty 2

## 2023-12-18 MED ORDER — LATANOPROST 0.005 % OP SOLN
1.0000 [drp] | Freq: Every day | OPHTHALMIC | Status: DC
  Filled 2023-12-18: qty 2.5

## 2023-12-18 MED ORDER — OXYCODONE HCL 5 MG PO TABS: 5.0000 mg | ORAL_TABLET | Freq: Four times a day (QID) | ORAL | 0 refills | Status: AC | PRN

## 2023-12-18 MED ORDER — ATORVASTATIN CALCIUM 40 MG PO TABS: 40.0000 mg | ORAL_TABLET | Freq: Every day | ORAL | 0 refills | Status: AC

## 2023-12-18 MED ORDER — AMLODIPINE BESYLATE 10 MG PO TABS: 10.0000 mg | ORAL_TABLET | Freq: Every day | ORAL | 0 refills | Status: AC

## 2023-12-18 MED ORDER — LISINOPRIL 20 MG PO TABS: 20.0000 mg | ORAL_TABLET | Freq: Every day | ORAL | 0 refills | Status: DC

## 2023-12-18 MED ORDER — ALUM & MAG HYDROXIDE-SIMETH 200-200-20 MG/5ML PO SUSP
15.0000 mL | Freq: Four times a day (QID) | ORAL | Status: DC | PRN
  Administered 2023-12-18: 15 mL via ORAL
  Filled 2023-12-18: qty 30

## 2023-12-18 MED ORDER — SENNOSIDES-DOCUSATE SODIUM 8.6-50 MG PO TABS: 2.0000 | ORAL_TABLET | Freq: Every evening | ORAL | Status: AC | PRN

## 2023-12-18 MED ORDER — BRIMONIDINE TARTRATE 0.15 % OP SOLN: 1.0000 [drp] | Freq: Three times a day (TID) | OPHTHALMIC | 0 refills | Status: AC

## 2023-12-18 MED ORDER — CLOPIDOGREL BISULFATE 75 MG PO TABS
75.0000 mg | ORAL_TABLET | Freq: Every day | ORAL | 0 refills | Status: DC
Start: 2023-12-19 — End: 2024-01-01

## 2023-12-18 MED ORDER — LACTULOSE 10 GM/15ML PO SOLN: 30.0000 g | Freq: Three times a day (TID) | ORAL | 0 refills | Status: AC

## 2023-12-18 MED ORDER — ESCITALOPRAM OXALATE 10 MG PO TABS: 10.0000 mg | ORAL_TABLET | Freq: Every day | ORAL | 0 refills | Status: AC

## 2023-12-18 NOTE — Progress Notes (Signed)
 Physical Therapy Treatment Patient Details Name: Scott Yoder. MRN: [id number] DOB: 1955/09/22 Today's Date: 12/18/2023   History of Present Illness 68 y/o male presented to ED on 12/11/23 for lower extremity weakness, mild confusion, and elevated BP. MRI showed acute R ACA infarct. PMH: HTN, CAD s/p CABG 2015, HLD    PT Comments  Pt is making limited progress towards goals. Pt received in bathroom with CNA who reports pt is possibly vasovagal response. Pt noted to be clammy, sweating, and SOB. Noted diarrhea as well. Vitals taken 112/88 and BG WNL. Pt assisted to transfer to recliner and then wheeled back to room. At end of session, pt positioned and feeling better. CNA and RN at bedside. Still is not at baseline. Will continue to progress as able. Continue to recommend therapy <3 hours.   If plan is discharge home, recommend the following: A little help with walking and/or transfers;A little help with bathing/dressing/bathroom;Supervision due to cognitive status;Direct supervision/assist for medications management;Assistance with cooking/housework;Help with stairs or ramp for entrance   Can travel by private vehicle     No  Equipment Recommendations  Rolling walker (2 wheels)    Recommendations for Other Services       Precautions / Restrictions Precautions Precautions: Fall Recall of Precautions/Restrictions: Impaired Precaution/Restrictions Comments: watch BP Restrictions Weight Bearing Restrictions Per Provider Order: No     Mobility  Bed Mobility               General bed mobility comments: N/T- pt received in bathroom    Transfers Overall transfer level: Needs assistance Equipment used: Rolling walker (2 wheels) Transfers: Sit to/from Stand Sit to Stand: Min assist   Step pivot transfers: Min assist, +2 physical assistance       General transfer comment: pt received in bathroom on toilet and had pre syncopal episode. Needed +2 for Step pivot from  toilet to recliner. Vitals and blood sugar taken prior to transfer. Pt with excessive sweating/confusion and decreased alertness level    Ambulation/Gait               General Gait Details: not safe to ambulate at this time due to pre syncopal episode   Stairs             Wheelchair Mobility     Tilt Bed    Modified Rankin (Stroke Patients Only)       Balance Overall balance assessment: Needs assistance Sitting-balance support: Feet supported Sitting balance-Leahy Scale: Good     Standing balance support: During functional activity, No upper extremity supported Standing balance-Leahy Scale: Poor                              Communication Communication Communication: Impaired Factors Affecting Communication: Difficulty expressing self  Cognition Arousal: Alert Behavior During Therapy: Flat affect   PT - Cognitive impairments: History of cognitive impairments                       PT - Cognition Comments: pt clammy and sweaty, appears confused while seated on BSC Following commands: Impaired Following commands impaired: Follows multi-step commands inconsistently    Cueing Cueing Techniques: Verbal cues, Gestural cues, Visual cues  Exercises Other Exercises Other Exercises: once transferred to recliner, wheeled back in room close to bed. Pt with increased alertness and RN cleared to keep in chair. NT in room to finish setting up pt including turning on chair  alarm    General Comments        Pertinent Vitals/Pain Pain Assessment Pain Assessment: No/denies pain    Home Living                          Prior Function            PT Goals (current goals can now be found in the care plan section) Acute Rehab PT Goals Patient Stated Goal: to figure things out PT Goal Formulation: With patient Time For Goal Achievement: 12/26/23 Potential to Achieve Goals: Good Progress towards PT goals: Progressing toward goals     Frequency    Min 2X/week      PT Plan      Co-evaluation              AM-PAC PT 6 Clicks Mobility   Outcome Measure  Help needed turning from your back to your side while in a flat bed without using bedrails?: A Little Help needed moving from lying on your back to sitting on the side of a flat bed without using bedrails?: A Lot Help needed moving to and from a bed to a chair (including a wheelchair)?: A Little Help needed standing up from a chair using your arms (e.g., wheelchair or bedside chair)?: A Little Help needed to walk in hospital room?: A Lot Help needed climbing 3-5 steps with a railing? : A Lot 6 Click Score: 15    End of Session   Activity Tolerance: Treatment limited secondary to medical complications (Comment);Other (comment) Patient left: in chair;with call bell/phone within reach;with chair alarm set Nurse Communication: Mobility status PT Visit Diagnosis: Muscle weakness (generalized) (M62.81);Other abnormalities of gait and mobility (R26.89);Unsteadiness on feet (R26.81)     Time: 9057-8997 PT Time Calculation (min) (ACUTE ONLY): 20 min  Charges:    $Therapeutic Activity: 8-22 mins PT General Charges $$ ACUTE PT VISIT: 1 Visit                     Corean Dade, PT, DPT, GCS [phone]    Izetta Sakamoto 12/18/2023, 11:13 AM

## 2023-12-18 NOTE — Discharge Summary (Signed)
 Physician Discharge Summary   Patient: Scott Yoder. MRN: [id number]  DOB: 15-May-1955   Admit:     Date of Admission: 12/11/2023 Admitted from: home   Discharge: Date of discharge: 12/18/23 Disposition: Home health Condition at discharge: fair  CODE STATUS: FULL CODE     Discharge Physician: Laneta Blunt, DO [facility]     PCP: [facility]  Recommendations for Outpatient Follow-up:  Follow up with PCP [facility] in 1 weeks Please obtain labs/tests: needs BMP in 2-3 days to monitor sodium levels    Discharge Instructions     Diet - low sodium heart healthy   Complete by: As directed    Increase activity slowly   Complete by: As directed          Discharge Diagnoses: Principal Problem:   TIA (transient ischemic attack)       Hospital course / significant events:   HPI:  Mr. Klee is a 68 y.o. male Hx CAD s/p CABG 2015, HTN, HLD (per patient not on statin as was taken of medication), who presents to ED for the third time since Sunday 08/17 due to lower extremity weakness Left >right as well as mild confusion. Per patient he woke up on Sunday 08/17 with these symptoms. which are not improving. Per RN patient has had intermittent episode of slurred speech in ED   Of note, to ED 08/11 and triaged for dental pain, weakness, but left from ED waiting room without being seen. Back to ED 08/12 after dentist concern for HTN (pt hadn't taken meds, BP 207/130 in triage). Appears he was also not seen / left ED this day too. 08/13 to ED c/o weakness.   08/13: third ED visit in 3 days, finally stayed for evaluation. Was admitted to hospitalist - concern for CVA and HRN emergency. U tox positive for cannabis and cocaine. CT head showing old CVA.  08/14: MRI brain showing acute R ICA infarct.  08/15: neurology consult - ASA 81mg  daily + plavix  75mg  daily x90 days f/b plavix  75mg  daily monotherapy after that and outpatient neuro  follow up. PT/OT recs for SNF rehab placement 08/16-08/20: TOC working on SNF placement.  08/20: pt decided to decline SNF and prefers home w/ home health, this was arranged and pt dc. He is educated that low sodium is a concern and I have dc hydrochlorothiazide  and he can liberalize salt intake some, but this will need close follow up - pt voices understanding     Consultants:  neurology  Procedures/Surgeries: none      ASSESSMENT & PLAN:   Acute ischemic stroke MRI of the brain showed acute right ICA infarct Plan for dc on DAPT x90 days f/b plavix  75mg  daily 2/2 severe intracranial stenosis  PT OT recommending acute rehab placement but pt has declined    Hyponatremia  12/18/23 sodium 130, yesterday 133, prior to that WNL D/c hydrochlorothiazide  Has been on salt restricted diet which he normally does not do, can liberalize  Monitor BMP - home health RN to draw labs  He is educated that low sodium is a concern and I have dc hydrochlorothiazide  and he can liberalize salt intake some, but this will need close follow up - pt voices understanding  Hypertensive Emergency - resolved Essential HTN Continue antihypertensives as ordered    Hypokalemia Replace as needed Monitor BMP   Multiple drug abuse Patient's U tox positive for cannabis and cocaine Counseled on cessation We will monitor closely  for withdrawals   HLD Lipid panel shows LDL 66 Statin    Mild Elevated LFTs Gallbladder polyp Abdominal ultrasound did not show any cholelithiasis or findings of acute cholecystitis. To have follow-up imaging in 2 years Monitor LFT   CAD s/p CABG Has completed 48 hours of permissive hypertension Continue aspirin , beta blocker, ACE/ARB, statin       underweight based on BMI: Body mass index is 17.68 kg/m..  Malnutrition Significantly low or high BMI is associated with higher medical risk.  Underweight - under 18  overweight - 25 to 29 obese - 30 or more Class 1  obesity: BMI of 30.0 to 34 Class 2 obesity: BMI of 35.0 to 39 Class 3 obesity: BMI of 40.0 to 49 Super Morbid Obesity: BMI 50-59 Super-super Morbid Obesity: BMI 60+ Healthy nutrition and physical activity advised as adjunct to other disease management and risk reduction treatments           Discharge Instructions  Allergies as of 12/18/2023   No Known Allergies      Medication List     STOP taking these medications    hydrochlorothiazide  25 MG tablet Commonly known as: HYDRODIURIL    lisinopril -hydrochlorothiazide  20-25 MG tablet Commonly known as: ZESTORETIC   metroNIDAZOLE  500 MG tablet Commonly known as: FLAGYL        TAKE these medications    amLODipine  10 MG tablet Commonly known as: NORVASC  Take 1 tablet (10 mg total) by mouth daily. What changed:  medication strength how much to take   aspirin  81 MG tablet Take 81 mg by mouth daily.   atorvastatin  40 MG tablet Commonly known as: LIPITOR Take 1 tablet (40 mg total) by mouth daily. Start taking on: December 19, 2023   brimonidine  0.15 % ophthalmic solution Commonly known as: ALPHAGAN  Place 1 drop into both eyes 3 (three) times daily.   carvedilol  25 MG tablet Commonly known as: COREG  Take 2 tablets by mouth 2 (two) times daily.   clopidogrel  75 MG tablet Commonly known as: PLAVIX  Take 1 tablet (75 mg total) by mouth daily. Start taking on: December 19, 2023   cyclobenzaprine 10 MG tablet Commonly known as: FLEXERIL Take 10 mg by mouth 3 (three) times daily as needed.   escitalopram  10 MG tablet Commonly known as: LEXAPRO  Take 1 tablet (10 mg total) by mouth daily. Start taking on: December 19, 2023   fluticasone  50 MCG/ACT nasal spray Commonly known as: FLONASE  Place 2 sprays into both nostrils daily.   hydrocortisone  2.5 % ointment Apply topically 2 (two) times daily.   lactulose  10 GM/15ML solution Commonly known as: CHRONULAC  Take 45 mLs (30 g total) by mouth 3 (three) times  daily.   latanoprost  0.005 % ophthalmic solution Commonly known as: XALATAN  Place 1 drop into both eyes at bedtime.   lisinopril  20 MG tablet Commonly known as: ZESTRIL  Take 1 tablet (20 mg total) by mouth daily.   nitroGLYCERIN 0.4 MG SL tablet Commonly known as: NITROSTAT Place 0.4 mg under the tongue 3 (three) times daily as needed. 1 tab every five minutes x 3, as needed x chest pain   oxyCODONE  5 MG immediate release tablet Commonly known as: Oxy IR/ROXICODONE  Take 1-2 tablets (5-10 mg total) by mouth every 6 (six) hours as needed for severe pain (pain score 7-10).   senna-docusate 8.6-50 MG tablet Commonly known as: Senokot-S Take 2 tablets by mouth at bedtime as needed for mild constipation.  Durable Medical Equipment  (From admission, onward)           Start     Ordered   12/18/23 1158  DME Walker  (Discharge Planning)  Once       Question Answer Comment  Walker: With 5 Inch Wheels   Patient needs a walker to treat with the following condition CVA (cerebral vascular accident) (HCC)      12/18/23 1158             Follow-up Information     [facility], [facility]  Follow up.   Why: The agency will call to schedule PT/OT/RN appointments. Contact information: [address] [place] [address] [phone]         [facility], [facility] Follow up.   Why: The vendor will deliver the RW to the patients room. Contact information: [address] [place] [address] [phone]                 No Known Allergies   Subjective: pt reports feeling fine and would like to go home after confirming w/ his daughter and his stepdaughter that they will be caring for him   Discharge Exam: BP 112/88 (BP Location: Left Arm) Comment: Pt sitting on comode sweating and saying he felt like passing out  Pulse 70   Temp 97.7 F (36.5 C)   Resp 20   Ht 5' 10 (1.778 m)   Wt 55.9 kg   SpO2 100%   BMI 17.68  kg/m  General: Pt is alert, awake, not in acute distress Cardiovascular: RRR, S1/S2 +, no rubs, no gallops Respiratory: CTA bilaterally, no wheezing, no rhonchi Abdominal: Soft, NT, ND, bowel sounds + Extremities: no edema, no cyanosis     The results of significant diagnostics from this hospitalization (including imaging, microbiology, ancillary and laboratory) are listed below for reference.     Microbiology: Recent Results (from the past 240 hours)  MRSA Next Gen by PCR, Nasal     Status: None   Collection Time: 12/11/23 10:03 PM   Specimen: Nasal Mucosa; Nasal Swab  Result Value Ref Range Status   MRSA by PCR Next Gen NOT DETECTED NOT DETECTED Final    Comment: (NOTE) The GeneXpert MRSA Assay (FDA approved for NASAL specimens only), is one component of a comprehensive MRSA colonization surveillance program. It is not intended to diagnose MRSA infection nor to guide or monitor treatment for MRSA infections. Test performance is not FDA approved in patients less than 53 years old. Performed at [facility], [address] [place], [place] [address]      Labs: BNP (last 3 results) No results for input(s): BNP in the last 8760 hours. Basic Metabolic Panel: Recent Labs  Lab 12/14/23 0502 12/15/23 0536 12/16/23 0511 12/17/23 0424 12/18/23 0526  NA 136 135 135 133* 130*  K 3.1* 3.7 3.5 3.4* 3.3*  CL 104 100 97* 94* 91*  CO2 24 26 25 29 27   GLUCOSE 104* 112* 109* 108* 113*  BUN 23 31* 27* 26* 23  CREATININE 0.63 0.90 0.97 0.79 0.77  CALCIUM  9.3 9.4 9.4 9.3 9.4   Liver Function Tests: No results for input(s): AST, ALT, ALKPHOS, BILITOT, PROT, ALBUMIN in the last 168 hours. No results for input(s): LIPASE, AMYLASE in the last 168 hours. No results for input(s): AMMONIA in the last 168 hours. CBC: Recent Labs  Lab 12/14/23 0502 12/15/23 0536 12/16/23 0511 12/17/23 0424 12/18/23 0526  WBC 5.3 7.6  6.1 5.9 7.0  NEUTROABS 2.3 4.2 3.3  2.9 3.4  HGB 16.1 16.1 16.2 16.5 17.0  HCT 44.8 44.4 43.4 44.5 46.4  MCV 89.4 89.5 88.2 88.1 88.9  PLT 261 276 273 291 324   Cardiac Enzymes: No results for input(s): CKTOTAL, CKMB, CKMBINDEX, TROPONINI in the last 168 hours. BNP: Invalid input(s): POCBNP CBG: Recent Labs  Lab 12/11/23 2148 12/14/23 1747 12/18/23 0955  GLUCAP 88 126* 138*   D-Dimer No results for input(s): DDIMER in the last 72 hours. Hgb A1c No results for input(s): HGBA1C in the last 72 hours. Lipid Profile No results for input(s): CHOL, HDL, LDLCALC, TRIG, CHOLHDL, LDLDIRECT in the last 72 hours. Thyroid function studies No results for input(s): TSH, T4TOTAL, T3FREE, THYROIDAB in the last 72 hours.  Invalid input(s): FREET3 Anemia work up No results for input(s): VITAMINB12, FOLATE, FERRITIN, TIBC, IRON, RETICCTPCT in the last 72 hours. Urinalysis    Component Value Date/Time   COLORURINE YELLOW (A) 12/11/2023 1347   APPEARANCEUR CLEAR (A) 12/11/2023 1347   APPEARANCEUR Clear 03/29/2012 1641   LABSPEC 1.014 12/11/2023 1347   LABSPEC 1.010 03/29/2012 1641   PHURINE 7.0 12/11/2023 1347   GLUCOSEU NEGATIVE 12/11/2023 1347   GLUCOSEU Negative 03/29/2012 1641   HGBUR NEGATIVE 12/11/2023 1347   BILIRUBINUR NEGATIVE 12/11/2023 1347   BILIRUBINUR Negative 03/29/2012 1641   KETONESUR NEGATIVE 12/11/2023 1347   PROTEINUR NEGATIVE 12/11/2023 1347   NITRITE NEGATIVE 12/11/2023 1347   LEUKOCYTESUR NEGATIVE 12/11/2023 1347   LEUKOCYTESUR Negative 03/29/2012 1641   Sepsis Labs Recent Labs  Lab 12/15/23 0536 12/16/23 0511 12/17/23 0424 12/18/23 0526  WBC 7.6 6.1 5.9 7.0   Microbiology Recent Results (from the past 240 hours)  MRSA Next Gen by PCR, Nasal     Status: None   Collection Time: 12/11/23 10:03 PM   Specimen: Nasal Mucosa; Nasal Swab  Result Value Ref Range Status   MRSA by PCR Next Gen NOT DETECTED NOT DETECTED Final    Comment: (NOTE) The  GeneXpert MRSA Assay (FDA approved for NASAL specimens only), is one component of a comprehensive MRSA colonization surveillance program. It is not intended to diagnose MRSA infection nor to guide or monitor treatment for MRSA infections. Test performance is not FDA approved in patients less than 28 years old. Performed at [facility], [address] [place], [place] [address]    Imaging ECHOCARDIOGRAM COMPLETE Result Date: 12/13/2023    ECHOCARDIOGRAM REPORT   Patient Name:   Aayan Birt Reinoso. Date of Exam: 12/13/2023 Medical Rec #:  [id number]             Height:       70.0 in Accession #:    [id number]            Weight:       123.2 lb Date of Birth:  03-May-1955             BSA:          1.699 m Patient Age:    68 years              BP:           145/90 mmHg Patient Gender: M                     HR:           53 bpm. Exam Location:  [facility] Procedure: 2D Echo, Cardiac Doppler and Color Doppler (Both Spectral and Color  Flow Doppler were utilized during procedure). Indications:     TIA G45.9  History:         Patient has no prior history of Echocardiogram examinations.                  CAD, TIA; Risk Factors:Diabetes and Hypertension.  Sonographer:     Thea Norlander RCS Referring Phys:  [id number] SARA-MAIZ A THOMAS Diagnosing Phys: Marsa Dooms MD IMPRESSIONS  1. Left ventricular ejection fraction, by estimation, is 55 to 60%. The left ventricle has normal function. The left ventricle has no regional wall motion abnormalities. Left ventricular diastolic parameters were normal.  2. Right ventricular systolic function is normal. The right ventricular size is normal.  3. The mitral valve is normal in structure. Trivial mitral valve regurgitation. No evidence of mitral stenosis.  4. The aortic valve is normal in structure. Aortic valve regurgitation is not visualized. No aortic stenosis is present.  5. The inferior vena cava is normal in size with greater than 50% respiratory  variability, suggesting right atrial pressure of 3 mmHg. FINDINGS  Left Ventricle: Left ventricular ejection fraction, by estimation, is 55 to 60%. The left ventricle has normal function. The left ventricle has no regional wall motion abnormalities. Strain was performed and the global longitudinal strain is indeterminate. The left ventricular internal cavity size was normal in size. There is no left ventricular hypertrophy. Left ventricular diastolic parameters were normal. Right Ventricle: The right ventricular size is normal. No increase in right ventricular wall thickness. Right ventricular systolic function is normal. Left Atrium: Left atrial size was normal in size. Right Atrium: Right atrial size was normal in size. Pericardium: There is no evidence of pericardial effusion. Mitral Valve: The mitral valve is normal in structure. Trivial mitral valve regurgitation. No evidence of mitral valve stenosis. Tricuspid Valve: The tricuspid valve is normal in structure. Tricuspid valve regurgitation is trivial. No evidence of tricuspid stenosis. Aortic Valve: The aortic valve is normal in structure. Aortic valve regurgitation is not visualized. No aortic stenosis is present. Aortic valve peak gradient measures 3.9 mmHg. Pulmonic Valve: The pulmonic valve was normal in structure. Pulmonic valve regurgitation is not visualized. No evidence of pulmonic stenosis. Aorta: The aortic root is normal in size and structure. Venous: The inferior vena cava is normal in size with greater than 50% respiratory variability, suggesting right atrial pressure of 3 mmHg. IAS/Shunts: No atrial level shunt detected by color flow Doppler. Additional Comments: 3D was performed not requiring image post processing on an independent workstation and was indeterminate.  LEFT VENTRICLE PLAX 2D LVIDd:         4.30 cm   Diastology LVIDs:         3.00 cm   LV e' medial:    7.29 cm/s LV PW:         1.20 cm   LV E/e' medial:  9.1 LV IVS:        1.10 cm    LV e' lateral:   10.30 cm/s LVOT diam:     2.30 cm   LV E/e' lateral: 6.4 LV SV:         58 LV SV Index:   34 LVOT Area:     4.15 cm  RIGHT VENTRICLE            IVC RV S prime:     9.57 cm/s  IVC diam: 1.50 cm TAPSE (M-mode): 1.5 cm LEFT ATRIUM  Index        RIGHT ATRIUM           Index LA diam:        2.40 cm 1.41 cm/m   RA Area:     12.00 cm LA Vol (A2C):   42.8 ml 25.16 ml/m  RA Volume:   27.30 ml  16.07 ml/m LA Vol (A4C):   37.0 ml 21.78 ml/m LA Biplane Vol: 39.3 ml 23.13 ml/m  AORTIC VALVE AV Area (Vmax): 3.07 cm AV Vmax:        98.50 cm/s AV Peak Grad:   3.9 mmHg LVOT Vmax:      72.80 cm/s LVOT Vmean:     40.400 cm/s LVOT VTI:       0.140 m  AORTA Ao Root diam: 3.80 cm Ao Asc diam:  3.70 cm MITRAL VALVE MV Area (PHT): 3.27 cm    SHUNTS MV Decel Time: 232 msec    Systemic VTI:  0.14 m MV E velocity: 66.00 cm/s  Systemic Diam: 2.30 cm MV A velocity: 61.30 cm/s MV E/A ratio:  1.08 Marsa Dooms MD Electronically signed by Marsa Dooms MD Signature Date/Time: 12/13/2023/1:29:47 PM    Final       Time coordinating discharge: over 30 minutes  SIGNED:  Ludell Zacarias DO [facility]

## 2023-12-18 NOTE — TOC Progression Note (Addendum)
 Transition of Care (TOC) - Progression Note    Patient Details  Name: Scott Yoder. MRN: [id number] Date of Birth: 08-Nov-1955  Transition of Care [facility]) CM/SW Contact  Dalia GORMAN Fuse, RN Phone Number: 12/18/2023, 11:40 AM  Clinical Narrative:     TOC met with the patient in his room to advise that [facility] in [place] offered a bed. The patient advised he no longer wants to go to a SNF and would like to discharge to home. TOC offered choice for home health and the patient did not have preference. TOC sent referral to [facility] PT/OT/RN, Shaun accepted the referral. TOC called [facility] with referral for RW, Mitch accepted                    Expected Discharge Plan and Services                                               Social Drivers of Health (SDOH) Interventions SDOH Screenings   Food Insecurity: No Food Insecurity (12/11/2023)  Housing: Low Risk  (12/11/2023)  Transportation Needs: No Transportation Needs (12/11/2023)  Utilities: Not At Risk (12/11/2023)  Financial Resource Strain: Low Risk  (10/05/2022)   Received from [facility]  Social Connections: Unknown (12/11/2023)  Tobacco Use: Low Risk  (12/11/2023)    Readmission Risk Interventions     No data to display

## 2023-12-18 NOTE — Hospital Course (Addendum)
 Hospital course / significant events:   HPI:  Scott Yoder is a 68 y.o. male Hx CAD s/p CABG 2015, HTN, HLD (per patient not on statin as was taken of medication), who presents to ED for the third time since Sunday 08/17 due to lower extremity weakness Left >right as well as mild confusion. Per patient he woke up on Sunday 08/17 with these symptoms. which are not improving. Per RN patient has had intermittent episode of slurred speech in ED   Of note, to ED 08/11 and triaged for dental pain, weakness, but left from ED waiting room without being seen. Back to ED 08/12 after dentist concern for HTN (pt hadn't taken meds, BP 207/130 in triage). Appears he was also not seen / left ED this day too. 08/13 to ED c/o weakness.   08/13: third ED visit in 3 days, finally stayed for evaluation. Was admitted to hospitalist - concern for CVA and HRN emergency. U tox positive for cannabis and cocaine. CT head showing old CVA.  08/14: MRI brain showing acute R ICA infarct.  08/15: neurology consult - ASA 81mg  daily + plavix  75mg  daily x90 days f/b plavix  75mg  daily monotherapy after that and outpatient neuro follow up. PT/OT recs for SNF rehab placement 08/16-08/20: TOC working on SNF placement.  08/20: pt decided to decline SNF and prefers home w/ home health, this was arranged and pt dc. He is educated that low sodium is a concern and I have dc hydrochlorothiazide  and he can liberalize salt intake some, but this will need close follow up      Consultants:  neurology  Procedures/Surgeries: none      ASSESSMENT & PLAN:   Acute ischemic stroke MRI of the brain showed acute right ICA infarct Plan for dc on DAPT x90 days f/b plavix  75mg  daily 2/2 severe intracranial stenosis  PT OT recommending acute rehab placement   Hyponatremia  12/18/23 sodium 130, yesterday 133, prior to that WNL D/c hydrochlorothiazide  Has been on salt restricted diet which he normally does not do, can liberalize  Monitor  BMP  Hypertensive Emergency - resolved Essential HTN Continue antihypertensives   Hypokalemia Replace as needed Monitor BMP   Multiple drug abuse Patient's U tox positive for cannabis and cocaine Counseled on cessation We will monitor closely for withdrawals   HLD not on statin currently  note to have mild elevated lfts  Lipid panel shows LDL 66 Statin    Mild Elevated LFTs Gallbladder polyp Abdominal ultrasound did not show any cholelithiasis or findings of acute cholecystitis. To have follow-up imaging in 2 years Monitor LFT   CAD s/p CABG Has completed 48 hours of permissive hypertension Continue aspirin , beta blocker, ACE/ARB, statin       underweight based on BMI: Body mass index is 17.68 kg/m..  Malnutrition Significantly low or high BMI is associated with higher medical risk.  Underweight - under 18  overweight - 25 to 29 obese - 30 or more Class 1 obesity: BMI of 30.0 to 34 Class 2 obesity: BMI of 35.0 to 39 Class 3 obesity: BMI of 40.0 to 49 Super Morbid Obesity: BMI 50-59 Super-super Morbid Obesity: BMI 60+ Healthy nutrition and physical activity advised as adjunct to other disease management and risk reduction treatments

## 2023-12-18 NOTE — Plan of Care (Signed)

## 2023-12-20 ENCOUNTER — Telehealth: Payer: Self-pay

## 2023-12-20 DIAGNOSIS — E7800 Pure hypercholesterolemia, unspecified: Secondary | ICD-10-CM | POA: Diagnosis not present

## 2023-12-20 DIAGNOSIS — E871 Hypo-osmolality and hyponatremia: Secondary | ICD-10-CM | POA: Diagnosis not present

## 2023-12-20 DIAGNOSIS — K824 Cholesterolosis of gallbladder: Secondary | ICD-10-CM | POA: Diagnosis not present

## 2023-12-20 DIAGNOSIS — Z681 Body mass index (BMI) 19 or less, adult: Secondary | ICD-10-CM | POA: Diagnosis not present

## 2023-12-20 DIAGNOSIS — Z7982 Long term (current) use of aspirin: Secondary | ICD-10-CM | POA: Diagnosis not present

## 2023-12-20 DIAGNOSIS — R531 Weakness: Secondary | ICD-10-CM | POA: Diagnosis not present

## 2023-12-20 DIAGNOSIS — E876 Hypokalemia: Secondary | ICD-10-CM | POA: Diagnosis not present

## 2023-12-20 DIAGNOSIS — Z7902 Long term (current) use of antithrombotics/antiplatelets: Secondary | ICD-10-CM | POA: Diagnosis not present

## 2023-12-20 DIAGNOSIS — E46 Unspecified protein-calorie malnutrition: Secondary | ICD-10-CM | POA: Diagnosis not present

## 2023-12-20 DIAGNOSIS — I69322 Dysarthria following cerebral infarction: Secondary | ICD-10-CM | POA: Diagnosis not present

## 2023-12-20 DIAGNOSIS — I10 Essential (primary) hypertension: Secondary | ICD-10-CM | POA: Diagnosis not present

## 2023-12-20 DIAGNOSIS — I2510 Atherosclerotic heart disease of native coronary artery without angina pectoris: Secondary | ICD-10-CM | POA: Diagnosis not present

## 2023-12-20 DIAGNOSIS — I69398 Other sequelae of cerebral infarction: Secondary | ICD-10-CM | POA: Diagnosis not present

## 2023-12-20 NOTE — Telephone Encounter (Signed)
 Not our patient

## 2023-12-20 NOTE — Telephone Encounter (Signed)
 Copied from CRM [id number]. Topic: Appointments - Appointment Scheduling >> Dec 19, 2023  4:52 PM Kevelyn M wrote: Patient needs appointment for a Hospital follow-up within 14 days. Please call him to schedule an appointment. He would be a new patient.  Call back #[phone]

## 2023-12-23 DIAGNOSIS — I10 Essential (primary) hypertension: Secondary | ICD-10-CM | POA: Diagnosis not present

## 2023-12-23 DIAGNOSIS — E871 Hypo-osmolality and hyponatremia: Secondary | ICD-10-CM | POA: Diagnosis not present

## 2023-12-23 DIAGNOSIS — Z7902 Long term (current) use of antithrombotics/antiplatelets: Secondary | ICD-10-CM | POA: Diagnosis not present

## 2023-12-23 DIAGNOSIS — Z681 Body mass index (BMI) 19 or less, adult: Secondary | ICD-10-CM | POA: Diagnosis not present

## 2023-12-23 DIAGNOSIS — I69398 Other sequelae of cerebral infarction: Secondary | ICD-10-CM | POA: Diagnosis not present

## 2023-12-23 DIAGNOSIS — E876 Hypokalemia: Secondary | ICD-10-CM | POA: Diagnosis not present

## 2023-12-23 DIAGNOSIS — E46 Unspecified protein-calorie malnutrition: Secondary | ICD-10-CM | POA: Diagnosis not present

## 2023-12-23 DIAGNOSIS — R531 Weakness: Secondary | ICD-10-CM | POA: Diagnosis not present

## 2023-12-23 DIAGNOSIS — I2510 Atherosclerotic heart disease of native coronary artery without angina pectoris: Secondary | ICD-10-CM | POA: Diagnosis not present

## 2023-12-23 DIAGNOSIS — K824 Cholesterolosis of gallbladder: Secondary | ICD-10-CM | POA: Diagnosis not present

## 2023-12-23 DIAGNOSIS — Z7982 Long term (current) use of aspirin: Secondary | ICD-10-CM | POA: Diagnosis not present

## 2023-12-23 DIAGNOSIS — I69322 Dysarthria following cerebral infarction: Secondary | ICD-10-CM | POA: Diagnosis not present

## 2023-12-23 DIAGNOSIS — E7800 Pure hypercholesterolemia, unspecified: Secondary | ICD-10-CM | POA: Diagnosis not present

## 2023-12-25 DIAGNOSIS — E871 Hypo-osmolality and hyponatremia: Secondary | ICD-10-CM | POA: Diagnosis not present

## 2023-12-25 DIAGNOSIS — K824 Cholesterolosis of gallbladder: Secondary | ICD-10-CM | POA: Diagnosis not present

## 2023-12-25 DIAGNOSIS — E46 Unspecified protein-calorie malnutrition: Secondary | ICD-10-CM | POA: Diagnosis not present

## 2023-12-25 DIAGNOSIS — E7800 Pure hypercholesterolemia, unspecified: Secondary | ICD-10-CM | POA: Diagnosis not present

## 2023-12-25 DIAGNOSIS — I2510 Atherosclerotic heart disease of native coronary artery without angina pectoris: Secondary | ICD-10-CM | POA: Diagnosis not present

## 2023-12-25 DIAGNOSIS — I69322 Dysarthria following cerebral infarction: Secondary | ICD-10-CM | POA: Diagnosis not present

## 2023-12-25 DIAGNOSIS — Z681 Body mass index (BMI) 19 or less, adult: Secondary | ICD-10-CM | POA: Diagnosis not present

## 2023-12-25 DIAGNOSIS — Z7982 Long term (current) use of aspirin: Secondary | ICD-10-CM | POA: Diagnosis not present

## 2023-12-25 DIAGNOSIS — I10 Essential (primary) hypertension: Secondary | ICD-10-CM | POA: Diagnosis not present

## 2023-12-25 DIAGNOSIS — I69398 Other sequelae of cerebral infarction: Secondary | ICD-10-CM | POA: Diagnosis not present

## 2023-12-25 DIAGNOSIS — R531 Weakness: Secondary | ICD-10-CM | POA: Diagnosis not present

## 2023-12-25 DIAGNOSIS — E876 Hypokalemia: Secondary | ICD-10-CM | POA: Diagnosis not present

## 2023-12-25 DIAGNOSIS — Z7902 Long term (current) use of antithrombotics/antiplatelets: Secondary | ICD-10-CM | POA: Diagnosis not present

## 2023-12-27 DIAGNOSIS — E876 Hypokalemia: Secondary | ICD-10-CM | POA: Diagnosis not present

## 2023-12-27 DIAGNOSIS — I10 Essential (primary) hypertension: Secondary | ICD-10-CM | POA: Diagnosis not present

## 2023-12-27 DIAGNOSIS — E785 Hyperlipidemia, unspecified: Secondary | ICD-10-CM | POA: Diagnosis not present

## 2023-12-27 DIAGNOSIS — K5900 Constipation, unspecified: Secondary | ICD-10-CM | POA: Diagnosis not present

## 2023-12-27 DIAGNOSIS — I63521 Cerebral infarction due to unspecified occlusion or stenosis of right anterior cerebral artery: Secondary | ICD-10-CM | POA: Diagnosis not present

## 2023-12-28 ENCOUNTER — Emergency Department

## 2023-12-28 ENCOUNTER — Inpatient Hospital Stay
Admission: EM | Admit: 2023-12-28 | Discharge: 2024-01-01 | DRG: 811 | Disposition: A | Discharge status: Home Health | Attending: Internal Medicine | Admitting: Internal Medicine

## 2023-12-28 ENCOUNTER — Other Ambulatory Visit: Payer: Self-pay

## 2023-12-28 DIAGNOSIS — K2970 Gastritis, unspecified, without bleeding: Secondary | ICD-10-CM | POA: Diagnosis present

## 2023-12-28 DIAGNOSIS — K921 Melena: Secondary | ICD-10-CM | POA: Diagnosis not present

## 2023-12-28 DIAGNOSIS — E785 Hyperlipidemia, unspecified: Secondary | ICD-10-CM | POA: Diagnosis not present

## 2023-12-28 DIAGNOSIS — K264 Chronic or unspecified duodenal ulcer with hemorrhage: Secondary | ICD-10-CM | POA: Diagnosis present

## 2023-12-28 DIAGNOSIS — E46 Unspecified protein-calorie malnutrition: Secondary | ICD-10-CM | POA: Diagnosis not present

## 2023-12-28 DIAGNOSIS — K269 Duodenal ulcer, unspecified as acute or chronic, without hemorrhage or perforation: Secondary | ICD-10-CM | POA: Diagnosis not present

## 2023-12-28 DIAGNOSIS — I9589 Other hypotension: Secondary | ICD-10-CM | POA: Diagnosis not present

## 2023-12-28 DIAGNOSIS — K922 Gastrointestinal hemorrhage, unspecified: Secondary | ICD-10-CM | POA: Diagnosis not present

## 2023-12-28 DIAGNOSIS — Z823 Family history of stroke: Secondary | ICD-10-CM | POA: Diagnosis not present

## 2023-12-28 DIAGNOSIS — D123 Benign neoplasm of transverse colon: Secondary | ICD-10-CM | POA: Diagnosis present

## 2023-12-28 DIAGNOSIS — R55 Syncope and collapse: Secondary | ICD-10-CM | POA: Diagnosis not present

## 2023-12-28 DIAGNOSIS — E869 Volume depletion, unspecified: Secondary | ICD-10-CM | POA: Diagnosis present

## 2023-12-28 DIAGNOSIS — K5900 Constipation, unspecified: Secondary | ICD-10-CM | POA: Diagnosis not present

## 2023-12-28 DIAGNOSIS — Z8249 Family history of ischemic heart disease and other diseases of the circulatory system: Secondary | ICD-10-CM

## 2023-12-28 DIAGNOSIS — Z951 Presence of aortocoronary bypass graft: Secondary | ICD-10-CM

## 2023-12-28 DIAGNOSIS — D649 Anemia, unspecified: Secondary | ICD-10-CM | POA: Diagnosis not present

## 2023-12-28 DIAGNOSIS — Z7982 Long term (current) use of aspirin: Secondary | ICD-10-CM | POA: Diagnosis not present

## 2023-12-28 DIAGNOSIS — E7800 Pure hypercholesterolemia, unspecified: Secondary | ICD-10-CM | POA: Diagnosis present

## 2023-12-28 DIAGNOSIS — I2510 Atherosclerotic heart disease of native coronary artery without angina pectoris: Secondary | ICD-10-CM | POA: Diagnosis present

## 2023-12-28 DIAGNOSIS — E876 Hypokalemia: Secondary | ICD-10-CM | POA: Diagnosis not present

## 2023-12-28 DIAGNOSIS — I459 Conduction disorder, unspecified: Secondary | ICD-10-CM | POA: Diagnosis present

## 2023-12-28 DIAGNOSIS — R000 Tachycardia, unspecified: Secondary | ICD-10-CM | POA: Diagnosis not present

## 2023-12-28 DIAGNOSIS — D62 Acute posthemorrhagic anemia: Secondary | ICD-10-CM | POA: Diagnosis not present

## 2023-12-28 DIAGNOSIS — R531 Weakness: Secondary | ICD-10-CM | POA: Diagnosis not present

## 2023-12-28 DIAGNOSIS — K635 Polyp of colon: Secondary | ICD-10-CM | POA: Diagnosis not present

## 2023-12-28 DIAGNOSIS — R569 Unspecified convulsions: Secondary | ICD-10-CM | POA: Diagnosis not present

## 2023-12-28 DIAGNOSIS — Z8673 Personal history of transient ischemic attack (TIA), and cerebral infarction without residual deficits: Secondary | ICD-10-CM

## 2023-12-28 DIAGNOSIS — Z79899 Other long term (current) drug therapy: Secondary | ICD-10-CM

## 2023-12-28 DIAGNOSIS — I672 Cerebral atherosclerosis: Secondary | ICD-10-CM | POA: Diagnosis not present

## 2023-12-28 DIAGNOSIS — I10 Essential (primary) hypertension: Secondary | ICD-10-CM | POA: Diagnosis present

## 2023-12-28 DIAGNOSIS — R7401 Elevation of levels of liver transaminase levels: Secondary | ICD-10-CM | POA: Diagnosis present

## 2023-12-28 DIAGNOSIS — E119 Type 2 diabetes mellitus without complications: Secondary | ICD-10-CM | POA: Diagnosis present

## 2023-12-28 DIAGNOSIS — F32A Depression, unspecified: Secondary | ICD-10-CM | POA: Diagnosis not present

## 2023-12-28 DIAGNOSIS — N400 Enlarged prostate without lower urinary tract symptoms: Secondary | ICD-10-CM | POA: Diagnosis not present

## 2023-12-28 DIAGNOSIS — K2950 Unspecified chronic gastritis without bleeding: Secondary | ICD-10-CM | POA: Diagnosis not present

## 2023-12-28 DIAGNOSIS — D6832 Hemorrhagic disorder due to extrinsic circulating anticoagulants: Secondary | ICD-10-CM | POA: Diagnosis not present

## 2023-12-28 DIAGNOSIS — G9389 Other specified disorders of brain: Secondary | ICD-10-CM | POA: Diagnosis not present

## 2023-12-28 DIAGNOSIS — Z833 Family history of diabetes mellitus: Secondary | ICD-10-CM

## 2023-12-28 DIAGNOSIS — E871 Hypo-osmolality and hyponatremia: Secondary | ICD-10-CM | POA: Diagnosis not present

## 2023-12-28 DIAGNOSIS — E861 Hypovolemia: Secondary | ICD-10-CM | POA: Diagnosis not present

## 2023-12-28 DIAGNOSIS — Z7902 Long term (current) use of antithrombotics/antiplatelets: Secondary | ICD-10-CM | POA: Diagnosis not present

## 2023-12-28 DIAGNOSIS — I70202 Unspecified atherosclerosis of native arteries of extremities, left leg: Secondary | ICD-10-CM | POA: Diagnosis not present

## 2023-12-28 DIAGNOSIS — I69398 Other sequelae of cerebral infarction: Secondary | ICD-10-CM | POA: Diagnosis not present

## 2023-12-28 DIAGNOSIS — R001 Bradycardia, unspecified: Secondary | ICD-10-CM | POA: Diagnosis not present

## 2023-12-28 DIAGNOSIS — T45525A Adverse effect of antithrombotic drugs, initial encounter: Secondary | ICD-10-CM | POA: Diagnosis present

## 2023-12-28 DIAGNOSIS — Q438 Other specified congenital malformations of intestine: Secondary | ICD-10-CM | POA: Diagnosis not present

## 2023-12-28 DIAGNOSIS — I69322 Dysarthria following cerebral infarction: Secondary | ICD-10-CM | POA: Diagnosis not present

## 2023-12-28 DIAGNOSIS — I959 Hypotension, unspecified: Secondary | ICD-10-CM

## 2023-12-28 DIAGNOSIS — I700 Atherosclerosis of aorta: Secondary | ICD-10-CM | POA: Diagnosis not present

## 2023-12-28 LAB — COMPREHENSIVE METABOLIC PANEL WITH GFR
ALT: 55 U/L — ABNORMAL HIGH (ref 0–44)
AST: 48 U/L — ABNORMAL HIGH (ref 15–41)
Albumin: 2.7 g/dL — ABNORMAL LOW (ref 3.5–5.0)
Alkaline Phosphatase: 36 U/L — ABNORMAL LOW (ref 38–126)
Anion gap: 9 (ref 5–15)
BUN: 40 mg/dL — ABNORMAL HIGH (ref 8–23)
CO2: 24 mmol/L (ref 22–32)
Calcium: 8.1 mg/dL — ABNORMAL LOW (ref 8.9–10.3)
Chloride: 99 mmol/L (ref 98–111)
Creatinine, Ser: 0.64 mg/dL (ref 0.61–1.24)
GFR, Estimated: >60 mL/min (ref >60)
Glucose, Bld: 115 mg/dL — ABNORMAL HIGH (ref 70–99)
Potassium: 3.8 mmol/L (ref 3.5–5.1)
Sodium: 132 mmol/L — ABNORMAL LOW (ref 135–145)
Total Bilirubin: 0.8 mg/dL (ref 0.0–1.2)
Total Protein: 5.5 g/dL — ABNORMAL LOW (ref 6.5–8.1)

## 2023-12-28 LAB — TROPONIN I (HIGH SENSITIVITY)
Troponin I (High Sensitivity): 6 ng/L (ref <18)
Troponin I (High Sensitivity): 6 ng/L (ref <18)

## 2023-12-28 LAB — ABO/RH: ABO/RH(D): B POS

## 2023-12-28 LAB — CBC WITH DIFFERENTIAL/PLATELET
Abs Immature Granulocytes: 0.02 K/uL (ref 0.00–0.07)
Basophils Absolute: 0 K/uL (ref 0.0–0.1)
Basophils Relative: 1 %
Eosinophils Absolute: 0.1 K/uL (ref 0.0–0.5)
Eosinophils Relative: 1 %
HCT: 23.5 % — ABNORMAL LOW (ref 39.0–52.0)
Hemoglobin: 8.5 g/dL — ABNORMAL LOW (ref 13.0–17.0)
Immature Granulocytes: 0 %
Lymphocytes Relative: 28 %
Lymphs Abs: 1.9 K/uL (ref 0.7–4.0)
MCH: 33.3 pg (ref 26.0–34.0)
MCHC: 36.2 g/dL — ABNORMAL HIGH (ref 30.0–36.0)
MCV: 92.2 fL (ref 80.0–100.0)
Monocytes Absolute: 0.6 K/uL (ref 0.1–1.0)
Monocytes Relative: 10 %
Neutro Abs: 4 K/uL (ref 1.7–7.7)
Neutrophils Relative %: 60 %
Platelets: 227 K/uL (ref 150–400)
RBC: 2.55 MIL/uL — ABNORMAL LOW (ref 4.22–5.81)
RDW: 12.9 % (ref 11.5–15.5)
WBC: 6.6 K/uL (ref 4.0–10.5)
nRBC: 0 % (ref 0.0–0.2)

## 2023-12-28 LAB — MAGNESIUM: Magnesium: 2 mg/dL (ref 1.7–2.4)

## 2023-12-28 LAB — HEMOGLOBIN AND HEMATOCRIT, BLOOD
HCT: 19.1 % — ABNORMAL LOW (ref 39.0–52.0)
HCT: 23.5 % — ABNORMAL LOW (ref 39.0–52.0)
Hemoglobin: 6.9 g/dL — ABNORMAL LOW (ref 13.0–17.0)
Hemoglobin: 8.6 g/dL — ABNORMAL LOW (ref 13.0–17.0)

## 2023-12-28 LAB — PROTIME-INR
INR: 1.1 (ref 0.8–1.2)
Prothrombin Time: 15.1 s (ref 11.4–15.2)

## 2023-12-28 LAB — PREPARE RBC (CROSSMATCH)

## 2023-12-28 LAB — APTT: aPTT: 28 s (ref 24–36)

## 2023-12-28 MED ORDER — ONDANSETRON HCL 4 MG PO TABS: 4.0000 mg | ORAL_TABLET | Freq: Four times a day (QID) | ORAL | Status: DC | PRN

## 2023-12-28 MED ORDER — SODIUM CHLORIDE 0.9 % IV SOLN: INTRAVENOUS | Status: AC

## 2023-12-28 MED ORDER — IOHEXOL 350 MG/ML SOLN
75.0000 mL | Freq: Once | INTRAVENOUS | Status: AC | PRN
  Administered 2023-12-28: 75 mL via INTRAVENOUS

## 2023-12-28 MED ORDER — ACETAMINOPHEN 325 MG PO TABS: 650.0000 mg | ORAL_TABLET | Freq: Four times a day (QID) | ORAL | Status: DC | PRN

## 2023-12-28 MED ORDER — TRAZODONE HCL 50 MG PO TABS
25.0000 mg | ORAL_TABLET | Freq: Every evening | ORAL | Status: DC | PRN
  Administered 2023-12-30: 25 mg via ORAL
  Filled 2023-12-28: qty 1

## 2023-12-28 MED ORDER — ONDANSETRON HCL 4 MG/2ML IJ SOLN: 4.0000 mg | Freq: Four times a day (QID) | INTRAMUSCULAR | Status: DC | PRN

## 2023-12-28 MED ORDER — LACTATED RINGERS IV BOLUS
1000.0000 mL | Freq: Once | INTRAVENOUS | Status: AC
  Administered 2023-12-28: 1000 mL via INTRAVENOUS

## 2023-12-28 MED ORDER — SODIUM CHLORIDE 0.9 % IV SOLN: 10.0000 mL/h | Freq: Once | INTRAVENOUS | Status: DC

## 2023-12-28 MED ORDER — PANTOPRAZOLE SODIUM 40 MG IV SOLR
40.0000 mg | Freq: Once | INTRAVENOUS | Status: AC
  Administered 2023-12-28: 40 mg via INTRAVENOUS
  Filled 2023-12-28: qty 10

## 2023-12-28 MED ORDER — ACETAMINOPHEN 650 MG RE SUPP: 650.0000 mg | Freq: Four times a day (QID) | RECTAL | Status: DC | PRN

## 2023-12-28 NOTE — ED Provider Notes (Signed)
 Signed out pending CT angiogram GI bleed protocol which shows no active bleeding.  Patient ordered for 1 unit of packed red blood cell.  Suspected lower GI bleeding with anemia and hypotension..  Admission.   Cyrena Mylar, MD 12/28/23 [phone]

## 2023-12-28 NOTE — ED Triage Notes (Signed)
 Pt BIB [facility] d/t being unresponsive after falling back into chair when trying to get up.  Family told EMS he was foaming at mouth.  Pt was hypotensive when EMS arrived BP 70/40.  Pt was given 500 cc of NS and placed in Trendelenburg. Pt had TIA 2 weeks ago.   Last BP :  Bp 119/64 HR 55 CBG 138

## 2023-12-28 NOTE — ED Provider Notes (Signed)
 [facility] Provider Note    Event Date/Time   First MD Initiated Contact with Patient 12/28/23 1959     (approximate)   History   Chief Complaint Hypotension   HPI  Scott Yoder. is a 68 y.o. male with past medical history of hypertension, diabetes, CAD, stroke, and TIA who presents to the ED complaining of hypotension.  Patient reports that he was at home this evening when he began to feel hot and dizzy.  He reportedly lost consciousness and fell back into his chair, had some foaming at the mouth at the time but no seizure activity was witnessed.  Patient is unsure whether he hit his head, denies significant headache or neck pain.  He was hypotensive when EMS first arrived at 70/40, given 500 cc of IV fluids with improvement.  He denies any chest pain or shortness of breath with this episode, states that he feels back to normal since arriving to the ED.     Physical Exam   Triage Vital Signs: ED Triage Vitals  Encounter Vitals Group     BP      Girls Systolic BP Percentile      Girls Diastolic BP Percentile      Boys Systolic BP Percentile      Boys Diastolic BP Percentile      Pulse      Resp      Temp      Temp src      SpO2      Weight      Height      Head Circumference      Peak Flow      Pain Score      Pain Loc      Pain Education      Exclude from Growth Chart     Most recent vital signs: Vitals:   12/28/23 2104 12/28/23 2125  BP: (!) 86/56 95/61  Pulse:    Resp: 16 14  Temp:    SpO2:      Constitutional: Alert and oriented. Eyes: Conjunctivae are normal. Head: Atraumatic. Nose: No congestion/rhinnorhea. Mouth/Throat: Mucous membranes are moist.  Neck: No midline cervical spine tenderness to palpation, able to rotate neck to 45 degrees bilaterally without pain. Cardiovascular: Normal rate, regular rhythm. Grossly normal heart sounds.  2+ radial pulses bilaterally. Respiratory: Normal respiratory effort.  No  retractions. Lungs CTAB. Gastrointestinal: Soft and nontender. No distention. Musculoskeletal: No lower extremity tenderness nor edema.  Neurologic:  Normal speech and language. No gross focal neurologic deficits are appreciated.    ED Results / Procedures / Treatments   Labs (all labs ordered are listed, but only abnormal results are displayed) Labs Reviewed  CBC WITH DIFFERENTIAL/PLATELET - Abnormal; Notable for the following components:      Result Value   RBC 2.55 (*)    Hemoglobin 8.5 (*)    HCT 23.5 (*)    MCHC 36.2 (*)    All other components within normal limits  COMPREHENSIVE METABOLIC PANEL WITH GFR - Abnormal; Notable for the following components:   Sodium 132 (*)    Glucose, Bld 115 (*)    BUN 40 (*)    Calcium  8.1 (*)    Total Protein 5.5 (*)    Albumin 2.7 (*)    AST 48 (*)    ALT 55 (*)    Alkaline Phosphatase 36 (*)    All other components within normal limits  HEMOGLOBIN AND HEMATOCRIT, BLOOD -  Abnormal; Notable for the following components:   Hemoglobin 6.9 (*)    HCT 19.1 (*)    All other components within normal limits  HEMOGLOBIN AND HEMATOCRIT, BLOOD - Abnormal; Notable for the following components:   Hemoglobin 8.6 (*)    HCT 23.5 (*)    All other components within normal limits  MAGNESIUM  PROTIME-INR  APTT  PREPARE RBC (CROSSMATCH)  TYPE AND SCREEN  ABO/RH  TROPONIN I (HIGH SENSITIVITY)  TROPONIN I (HIGH SENSITIVITY)     EKG  ED ECG REPORT I, Carlin Palin, the attending physician, personally viewed and interpreted this ECG.   Date: 12/28/2023  EKG Time: 20:08  Rate: 56  Rhythm: normal sinus rhythm  Axis: Normal  Intervals:nonspecific intraventricular conduction delay  ST&T Change: None  RADIOLOGY CT head reviewed and interpreted by me with no hemorrhage or midline shift.  PROCEDURES:  Critical Care performed: Yes, see critical care procedure note(s)  .Critical Care  Performed by: Palin Carlin, MD Authorized by:  Palin Carlin, MD   Critical care provider statement:    Critical care time (minutes):  30   Critical care time was exclusive of:  Separately billable procedures and treating other patients and teaching time   Critical care was necessary to treat or prevent imminent or life-threatening deterioration of the following conditions: GI bleed, anemia.   Critical care was time spent personally by me on the following activities:  Development of treatment plan with patient or surrogate, discussions with consultants, evaluation of patient's response to treatment, examination of patient, ordering and review of laboratory studies, ordering and review of radiographic studies, ordering and performing treatments and interventions, pulse oximetry, re-evaluation of patient's condition and review of old charts   I assumed direction of critical care for this patient from another provider in my specialty: no     Care discussed with: admitting provider      MEDICATIONS ORDERED IN ED: Medications  0.9 %  sodium chloride  infusion (has no administration in time range)  pantoprazole  (PROTONIX ) injection 40 mg (has no administration in time range)  lactated ringers  bolus 1,000 mL (0 mLs Intravenous Stopped 12/28/23 2208)  lactated ringers  bolus 1,000 mL (0 mLs Intravenous Stopped 12/28/23 2319)  iohexol  (OMNIPAQUE ) 350 MG/ML injection 75 mL (75 mLs Intravenous Contrast Given 12/28/23 2234)     IMPRESSION / MDM / ASSESSMENT AND PLAN / ED COURSE  I reviewed the triage vital signs and the nursing notes.                              68 y.o. male with past medical history of hypertension, diabetes, CAD, and stroke who presents to the ED following episode of loss of consciousness where he had foaming at the mouth.  Patient's presentation is most consistent with acute presentation with potential threat to life or bodily function.  Differential diagnosis includes, but is not limited to, syncope, seizure, arrhythmia, ACS,  anemia, electrolyte abnormality, AKI, vasovagal episode, orthostatic hypotension.  Patient nontoxic-appearing and in no acute distress, vital signs remarkable for borderline bradycardia and borderline hypotension.  He is currently awake and alert, denies any complaints, and has no focal neurologic deficits on exam.  EKG shows sinus bradycardia without ischemic changes, will observe on cardiac monitor and screening labs.  Will also check CT head given episode could represent seizure, no evidence of significant head or neck trauma.  Plan to hydrate with IV fluids and reassess.  CT  head is negative for acute process, labs show significant anemia with hemoglobin of 8.6 compared to 17 ten days ago.  This was repeated to confirm, found to be 6.9, which was likely lab error.  H&H was again repeated and had a similar result at 8.5.  Patient denies any recent bleeding, however rectal exam with guaiac positive dark stool.  Patient was given IV Protonix  and CTA performed of his abdomen and pelvis, which shows no active extravasation or other acute finding.  With his soft blood pressures, he was transfused 1 unit PRBCs and BP does seem to be improving following additional IV fluids.  Additional labs without significant electrolyte abnormality or AKI, LFTs are unremarkable.  Case discussed with hospitalist for admission.      FINAL CLINICAL IMPRESSION(S) / ED DIAGNOSES   Final diagnoses:  Gastrointestinal hemorrhage, unspecified gastrointestinal hemorrhage type     Rx / DC Orders   ED Discharge Orders     None        Note:  This document was prepared using Dragon voice recognition software and may include unintentional dictation errors.   Willo Dunnings, MD 12/28/23 [phone]

## 2023-12-29 ENCOUNTER — Encounter: Payer: Self-pay | Admitting: Family Medicine

## 2023-12-29 DIAGNOSIS — E785 Hyperlipidemia, unspecified: Secondary | ICD-10-CM

## 2023-12-29 DIAGNOSIS — F32A Depression, unspecified: Secondary | ICD-10-CM | POA: Insufficient documentation

## 2023-12-29 DIAGNOSIS — K922 Gastrointestinal hemorrhage, unspecified: Secondary | ICD-10-CM

## 2023-12-29 DIAGNOSIS — I10 Essential (primary) hypertension: Secondary | ICD-10-CM

## 2023-12-29 DIAGNOSIS — E119 Type 2 diabetes mellitus without complications: Secondary | ICD-10-CM

## 2023-12-29 DIAGNOSIS — I959 Hypotension, unspecified: Secondary | ICD-10-CM

## 2023-12-29 DIAGNOSIS — D62 Acute posthemorrhagic anemia: Secondary | ICD-10-CM

## 2023-12-29 LAB — BASIC METABOLIC PANEL WITH GFR
Anion gap: 9 (ref 5–15)
BUN: 31 mg/dL — ABNORMAL HIGH (ref 8–23)
CO2: 26 mmol/L (ref 22–32)
Calcium: 8.2 mg/dL — ABNORMAL LOW (ref 8.9–10.3)
Chloride: 99 mmol/L (ref 98–111)
Creatinine, Ser: 0.62 mg/dL (ref 0.61–1.24)
GFR, Estimated: >60 mL/min (ref >60)
Glucose, Bld: 99 mg/dL (ref 70–99)
Potassium: 3.5 mmol/L (ref 3.5–5.1)
Sodium: 134 mmol/L — ABNORMAL LOW (ref 135–145)

## 2023-12-29 LAB — CBC
HCT: 24.3 % — ABNORMAL LOW (ref 39.0–52.0)
Hemoglobin: 8.9 g/dL — ABNORMAL LOW (ref 13.0–17.0)
MCH: 32.7 pg (ref 26.0–34.0)
MCHC: 36.6 g/dL — ABNORMAL HIGH (ref 30.0–36.0)
MCV: 89.3 fL (ref 80.0–100.0)
Platelets: 292 K/uL (ref 150–400)
RBC: 2.72 MIL/uL — ABNORMAL LOW (ref 4.22–5.81)
RDW: 13.8 % (ref 11.5–15.5)
WBC: 7.9 K/uL (ref 4.0–10.5)
nRBC: 0 % (ref 0.0–0.2)

## 2023-12-29 LAB — HEMOGLOBIN AND HEMATOCRIT, BLOOD
HCT: 25.4 % — ABNORMAL LOW (ref 39.0–52.0)
HCT: 25.4 % — ABNORMAL LOW (ref 39.0–52.0)
HCT: 20.2 % — ABNORMAL LOW (ref 39.0–52.0)
Hemoglobin: 9 g/dL — ABNORMAL LOW (ref 13.0–17.0)
Hemoglobin: 9.2 g/dL — ABNORMAL LOW (ref 13.0–17.0)
Hemoglobin: 7.2 g/dL — ABNORMAL LOW (ref 13.0–17.0)

## 2023-12-29 LAB — IRON AND TIBC
Iron: 70 ug/dL (ref 45–182)
Saturation Ratios: 23 % (ref 17.9–39.5)
TIBC: 311 ug/dL (ref 250–450)
UIBC: 241 ug/dL

## 2023-12-29 LAB — FOLATE: Folate: 9.8 ng/mL (ref >5.9)

## 2023-12-29 LAB — VITAMIN B12: Vitamin B-12: 347 pg/mL (ref 180–914)

## 2023-12-29 LAB — FERRITIN: Ferritin: 249 ng/mL (ref 24–336)

## 2023-12-29 MED ORDER — CYCLOBENZAPRINE HCL 10 MG PO TABS: 10.0000 mg | ORAL_TABLET | Freq: Three times a day (TID) | ORAL | Status: DC | PRN

## 2023-12-29 MED ORDER — NITROGLYCERIN 0.4 MG SL SUBL: 0.4000 mg | SUBLINGUAL_TABLET | Freq: Three times a day (TID) | SUBLINGUAL | Status: DC | PRN

## 2023-12-29 MED ORDER — AMLODIPINE BESYLATE 10 MG PO TABS: 10.0000 mg | ORAL_TABLET | Freq: Every day | ORAL | Status: DC

## 2023-12-29 MED ORDER — OXYCODONE HCL 5 MG PO TABS
5.0000 mg | ORAL_TABLET | Freq: Four times a day (QID) | ORAL | Status: DC | PRN
  Administered 2023-12-30: 10 mg via ORAL
  Filled 2023-12-29: qty 2

## 2023-12-29 MED ORDER — LATANOPROST 0.005 % OP SOLN
1.0000 [drp] | Freq: Every day | OPHTHALMIC | Status: DC
  Administered 2023-12-29 – 2023-12-31 (×3): 1 [drp] via OPHTHALMIC
  Filled 2023-12-29: qty 2.5

## 2023-12-29 MED ORDER — FLUTICASONE PROPIONATE 50 MCG/ACT NA SUSP: 2.0000 | Freq: Every day | NASAL | Status: DC | PRN

## 2023-12-29 MED ORDER — LACTULOSE 10 GM/15ML PO SOLN
20.0000 g | Freq: Two times a day (BID) | ORAL | Status: DC
  Administered 2023-12-29 – 2023-12-31 (×4): 20 g via ORAL
  Filled 2023-12-29 (×5): qty 30

## 2023-12-29 MED ORDER — LISINOPRIL 20 MG PO TABS: 20.0000 mg | ORAL_TABLET | Freq: Every day | ORAL | Status: DC

## 2023-12-29 MED ORDER — HYDROCORTISONE 1 % EX CREA: 1.0000 | TOPICAL_CREAM | Freq: Two times a day (BID) | CUTANEOUS | Status: DC | PRN

## 2023-12-29 MED ORDER — BRIMONIDINE TARTRATE 0.2 % OP SOLN
1.0000 [drp] | Freq: Two times a day (BID) | OPHTHALMIC | Status: DC
  Administered 2023-12-29 – 2024-01-01 (×7): 1 [drp] via OPHTHALMIC
  Filled 2023-12-29: qty 5

## 2023-12-29 MED ORDER — PANTOPRAZOLE SODIUM 40 MG IV SOLR
40.0000 mg | Freq: Two times a day (BID) | INTRAVENOUS | Status: DC
  Administered 2023-12-29 – 2024-01-01 (×7): 40 mg via INTRAVENOUS
  Filled 2023-12-29 (×8): qty 10

## 2023-12-29 MED ORDER — ATORVASTATIN CALCIUM 20 MG PO TABS
40.0000 mg | ORAL_TABLET | Freq: Every day | ORAL | Status: DC
  Administered 2023-12-29 – 2024-01-01 (×4): 40 mg via ORAL
  Filled 2023-12-29 (×4): qty 2

## 2023-12-29 MED ORDER — ESCITALOPRAM OXALATE 10 MG PO TABS
10.0000 mg | ORAL_TABLET | Freq: Every day | ORAL | Status: DC
  Administered 2023-12-29 – 2024-01-01 (×4): 10 mg via ORAL
  Filled 2023-12-29 (×4): qty 1

## 2023-12-29 MED ORDER — CARVEDILOL 25 MG PO TABS: 50.0000 mg | ORAL_TABLET | Freq: Two times a day (BID) | ORAL | Status: DC

## 2023-12-29 NOTE — H&P (Signed)
 [place]   PATIENT NAME: Scott Yoder    MR#:  [id number]  DATE OF BIRTH:  Mar 08, 1956  DATE OF ADMISSION:  12/28/2023  PRIMARY CARE PHYSICIAN: [facility]   Patient is coming from: Home  REQUESTING/REFERRING PHYSICIAN: Cyrena Mylar, MD  CHIEF COMPLAINT:   Chief Complaint  Patient presents with   Hypotension    HISTORY OF PRESENT ILLNESS:  Scott Makhari Dovidio. is a 68 y.o. male with medical history significant for coronary artery disease on aspirin  and Plavix , type 2 diabetes mellitus, TIA, hypertension and dyslipidemia, who presented to the emergency room with acute onset of hypotension.  Patient was at home this evening when he began to feel dizzy and hot and then lost consciousness falling back into his chair.Scott Yoder  He was having foaming in the mouth at the time with no seizure activity witnessed.  It is unclear if he hit his head.  He denies any nausea or vomiting or abdominal pain.  No heartburn or diarrhea or melena or bright red bleeding per rectum.  He was hypotensive per EMS with a BP of 72/40 upon their arrival.  He was given 500 mL IV normal saline bolus with improvement.  He denied any chest pain or palpitations.  No cough or wheezing or hemoptysis.  No dysuria, oliguria or hematuria or flank pain.  The patient had maroon-colored stool with positive stool Hemoccult by the EDP. ED Course: When he came to the ER, BP was 98/64 and later 81/55 with a heart rate of 57 otherwise normal vital signs.  BP later on was 104/73 and heart rate was 65.  Labs revealed hemoglobin of 8.5 and hematocrit 23.5 compared to 17/46.4 on 12/18/2023.  Repeat H&H was 6.9 and 19.1.  Sodium was 132 and BUN of 40 with calcium  of 8.1 and albumin 2.7, AST 48 and ALT 55 with total protein of 5.5.  PT and INR as well as PTT were normal.  Blood group was B+ with negative antibody screen. EKG as reviewed by me : EKG showed normal sinus rhythm with a rate of 56 with nonspecific intraventricular  conduction delay. Imaging: CTA of the abdomen and pelvis with and without contrast revealed no evidence for active GI bleeding.  Showed severe atherosclerotic disease of the aorta and its branches and 50% stenosis in the left common iliac artery and left external iliac artery, prostatomegaly with no other acute abnormalities.  The patient was given 40 mg of IV Protonix  and 2 L bolus of IV Ringer.  He will be admitted to a medical telemetry bed for further evaluation and management. PAST MEDICAL HISTORY:   Past Medical History:  Diagnosis Date   Coronary artery disease    Dyslipidemia 12/29/2023   Hernia, inguinal    High cholesterol    Hypertension    Hypokalemia   -Type 2 diabetes mellitus TIA  PAST SURGICAL HISTORY:   Past Surgical History:  Procedure Laterality Date   CORONARY ARTERY BYPASS GRAFT     TRACHEOSTOMY      SOCIAL HISTORY:   Social History   Tobacco Use   Smoking status: Never   Smokeless tobacco: Never  Substance Use Topics   Alcohol use: Yes    Comment: 40 every 4 days    FAMILY HISTORY:   Family History  Problem Relation Age of Onset   Diabetes Mother    Hypertension Mother    Stroke Mother     DRUG ALLERGIES:  No Known Allergies  REVIEW OF SYSTEMS:   ROS As per history of present illness. All pertinent systems were reviewed above. Constitutional, HEENT, cardiovascular, respiratory, GI, GU, musculoskeletal, neuro, psychiatric, endocrine, integumentary and hematologic systems were reviewed and are otherwise negative/unremarkable except for positive findings mentioned above in the HPI.   MEDICATIONS AT HOME:   Prior to Admission medications   Medication Sig Start Date End Date Taking? Authorizing Provider  amLODipine  (NORVASC ) 10 MG tablet Take 1 tablet (10 mg total) by mouth daily. 12/18/23   Alexander, Natalie, DO  aspirin  81 MG tablet Take 81 mg by mouth daily.    [provider]  atorvastatin  (LIPITOR) 40 MG tablet Take 1 tablet  (40 mg total) by mouth daily. 12/19/23   Alexander, Natalie, DO  brimonidine  (ALPHAGAN ) 0.15 % ophthalmic solution Place 1 drop into both eyes 3 (three) times daily. 12/18/23   Alexander, Natalie, DO  carvedilol  (COREG ) 25 MG tablet Take 2 tablets by mouth 2 (two) times daily.    [provider]  clopidogrel  (PLAVIX ) 75 MG tablet Take 1 tablet (75 mg total) by mouth daily. 12/19/23   Alexander, Natalie, DO  cyclobenzaprine  (FLEXERIL ) 10 MG tablet Take 10 mg by mouth 3 (three) times daily as needed.    [provider]  escitalopram  (LEXAPRO ) 10 MG tablet Take 1 tablet (10 mg total) by mouth daily. 12/19/23   Alexander, Natalie, DO  fluticasone  (FLONASE ) 50 MCG/ACT nasal spray Place 2 sprays into both nostrils daily. 08/26/17   Fisher, Devere ORN, PA-C  hydrocortisone  2.5 % ointment Apply topically 2 (two) times daily. 09/24/18   Triplett, Cari B, FNP  lactulose  (CHRONULAC ) 10 GM/15ML solution Take 45 mLs (30 g total) by mouth 3 (three) times daily. 12/18/23   Alexander, Natalie, DO  latanoprost  (XALATAN ) 0.005 % ophthalmic solution Place 1 drop into both eyes at bedtime. 12/02/23   [provider]  lisinopril  (ZESTRIL ) 20 MG tablet Take 1 tablet (20 mg total) by mouth daily. 12/18/23 03/17/24  Alexander, Natalie, DO  nitroGLYCERIN  (NITROSTAT ) 0.4 MG SL tablet Place 0.4 mg under the tongue 3 (three) times daily as needed. 1 tab every five minutes x 3, as needed x chest pain    [provider]  oxyCODONE  (OXY IR/ROXICODONE ) 5 MG immediate release tablet Take 1-2 tablets (5-10 mg total) by mouth every 6 (six) hours as needed for severe pain (pain score 7-10). 12/18/23   Alexander, Natalie, DO  senna-docusate (SENOKOT-S) 8.6-50 MG tablet Take 2 tablets by mouth at bedtime as needed for mild constipation. 12/18/23   Alexander, Natalie, DO      VITAL SIGNS:  Blood pressure 101/70, pulse 68, temperature (!) 97.5 F (36.4 C), temperature source Oral, resp. rate 20, height 5' 10 (1.778  m), weight 60.3 kg, SpO2 100%.  PHYSICAL EXAMINATION:  Physical Exam  GENERAL:  68 y.o.-year-old African-American patient lying in the bed with no acute distress.  EYES: Pupils equal, round, reactive to light and accommodation. No scleral icterus.  Positive pallor.  Extraocular muscles intact.  HEENT: Head atraumatic, normocephalic. Oropharynx and nasopharynx clear.  NECK:  Supple, no jugular venous distention. No thyroid enlargement, no tenderness.  LUNGS: Normal breath sounds bilaterally, no wheezing, rales,rhonchi or crepitation. No use of accessory muscles of respiration.  CARDIOVASCULAR: Regular rate and rhythm, S1, S2 normal. No murmurs, rubs, or gallops.  ABDOMEN: Soft, nondistended, nontender. Bowel sounds present. No organomegaly or mass.  EXTREMITIES: No pedal edema, cyanosis, or clubbing.  NEUROLOGIC: Cranial nerves II through XII are intact. Muscle  strength 5/5 in all extremities. Sensation intact. Gait not checked.  PSYCHIATRIC: The patient is alert and oriented x 3.  Normal affect and good eye contact. SKIN: No obvious rash, lesion, or ulcer.   LABORATORY PANEL:   CBC Recent Labs  Lab 12/28/23 2013 12/28/23 2055 12/28/23 2114  WBC 6.6  --   --   HGB 8.5*   < > 8.6*  HCT 23.5*   < > 23.5*  PLT 227  --   --    < > = values in this interval not displayed.   ------------------------------------------------------------------------------------------------------------------  Chemistries  Recent Labs  Lab 12/28/23 2013  NA 132*  K 3.8  CL 99  CO2 24  GLUCOSE 115*  BUN 40*  CREATININE 0.64  CALCIUM  8.1*  MG 2.0  AST 48*  ALT 55*  ALKPHOS 36*  BILITOT 0.8   ------------------------------------------------------------------------------------------------------------------  Cardiac Enzymes No results for input(s): TROPONINI in the last 168  hours. ------------------------------------------------------------------------------------------------------------------  RADIOLOGY:  CT Angio Abd/Pel W and/or Wo Contrast Result Date: 12/28/2023 CLINICAL DATA:  Lower GI bleeding EXAM: CTA ABDOMEN AND PELVIS WITHOUT AND WITH CONTRAST TECHNIQUE: Multidetector CT imaging of the abdomen and pelvis was performed using the standard protocol during bolus administration of intravenous contrast. Multiplanar reconstructed images and MIPs were obtained and reviewed to evaluate the vascular anatomy. RADIATION DOSE REDUCTION: This exam was performed according to the departmental dose-optimization program which includes automated exposure control, adjustment of the mA and/or kV according to patient size and/or use of iterative reconstruction technique. CONTRAST:  75mL OMNIPAQUE  IOHEXOL  350 MG/ML SOLN COMPARISON:  None Available. FINDINGS: VASCULAR Aorta: Normal caliber aorta without aneurysm, dissection, vasculitis or significant stenosis. Atherosclerotic calcifications are present. Celiac: Patent without evidence of aneurysm, dissection, vasculitis or significant stenosis. SMA: Patent without evidence of aneurysm, dissection, vasculitis or significant stenosis. Common origin with celiac axis. Renals: Both renal arteries are patent without evidence of aneurysm, dissection, vasculitis, fibromuscular dysplasia or significant stenosis. IMA: Patent without evidence of aneurysm, dissection, vasculitis or significant stenosis. Inflow: Severe atherosclerotic disease present. There is 50% focal stenosis in the proximal left common iliac artery secondary to calcified atherosclerotic disease. There is also 50% stenosis in the mid left external iliac artery secondary to calcified and noncalcified atherosclerotic disease. Right-sided iliac arteries appear patent without significant stenosis or aneurysm. No dissection. There is also stenosis of the origin of the left internal iliac  artery secondary to calcified and noncalcified atherosclerotic disease. Proximal Outflow: Bilateral common femoral and visualized portions of the superficial and profunda femoral arteries are patent without evidence of aneurysm, dissection, vasculitis or significant stenosis. Calcified atherosclerotic disease present. Veins: No obvious venous abnormality within the limitations of this arterial phase study. Review of the MIP images confirms the above findings. NON-VASCULAR Lower chest: No acute abnormality. Hepatobiliary: No focal liver abnormality is seen. No gallstones, gallbladder wall thickening, or biliary dilatation. Pancreas: Unremarkable. No pancreatic ductal dilatation or surrounding inflammatory changes. Spleen: Normal in size without focal abnormality. Adrenals/Urinary Tract: Adrenal glands are unremarkable. Kidneys are normal, without renal calculi, focal lesion, or hydronephrosis. Bladder is unremarkable. Stomach/Bowel: Stomach is within normal limits. Appendix is not seen. No evidence of bowel wall thickening, distention, or inflammatory changes. Lymphatic: Prostate gland is enlarged. Reproductive: Uterus and bilateral adnexa are unremarkable. Other: Degenerative changes affect the spine. Musculoskeletal: No fracture is seen. IMPRESSION: 1. No evidence for active GI bleeding. 2. Severe atherosclerotic disease of the aorta and its branches. 3. 50% stenosis in the left common iliac artery and  left external iliac artery. 4. No acute localizing process in the abdomen or pelvis. 5. Prostatomegaly. Electronically Signed   By: Greig Pique M.D.   On: 12/28/2023 23:13   CT Head Wo Contrast Result Date: 12/28/2023 CLINICAL DATA:  Seizure, new-onset, no history of trauma EXAM: CT HEAD WITHOUT CONTRAST TECHNIQUE: Contiguous axial images were obtained from the base of the skull through the vertex without intravenous contrast. RADIATION DOSE REDUCTION: This exam was performed according to the departmental  dose-optimization program which includes automated exposure control, adjustment of the mA and/or kV according to patient size and/or use of iterative reconstruction technique. COMPARISON:  MRI head 12/11/2023 FINDINGS: Brain: Right temporal and left cerebellar lobe encephalomalacia. Patchy and confluent areas of decreased attenuation are noted throughout the deep and periventricular white matter of the cerebral hemispheres bilaterally, compatible with chronic microvascular ischemic disease. No evidence of large-territorial acute infarction. No parenchymal hemorrhage. No mass lesion. No extra-axial collection. No mass effect or midline shift. No hydrocephalus. Basilar cisterns are patent. Vascular: No hyperdense vessel. Atherosclerotic calcifications are present within the cavernous internal carotid and vertebral arteries. Skull: No acute fracture or focal lesion. Sinuses/Orbits: Paranasal sinuses and mastoid air cells are clear. The orbits are unremarkable. Other: None. IMPRESSION: No acute intracranial abnormality. Electronically Signed   By: Morgane  Naveau M.D.   On: 12/28/2023 20:30      IMPRESSION AND PLAN:  Assessment and Plan: * GI bleeding - The patient will be admitted to a medical telemetry bed. - Will follow serial hemoglobins and hematocrits. - Will hold off aspirin  and Plavix . - He was typed and crossmatch and will be transfused 2 units of packed red blood cells. - We will follow posttransfusion H&H. - Will place the patient on IV PPI therapy with Protonix  - GI consult will be obtained. - I notified Dr. Unk about the patient  ABLA (acute blood loss anemia) - This is secondary to #1. - Management as above.  Hypotension - This is likely secondary to volume depletion associated with GI bleeding. - Management as above. - Will continue hydration with IV normal saline.  Dyslipidemia - Will continue statin therapy.  Essential hypertension - Given his GI bleeding and hypotension  we will hold off antihypertensive therapy. - Will place the patient on as needed IV hydralazine  for now.  Type 2 diabetes mellitus without complications (HCC) - The patient will be placed on supplemental coverage with NovoLog.  Depression - Will continue Lexapro .   DVT prophylaxis: SCDs. Advanced Care Planning:  Code Status: full code. Family Communication:  The plan of care was discussed in details with the patient (and family). I answered all questions. The patient agreed to proceed with the above mentioned plan. Further management will depend upon hospital course. Disposition Plan: Back to previous home environment Consults called: GI All the records are reviewed and case discussed with ED provider.  Status is: Inpatient  At the time of the admission, it appears that the appropriate admission status for this patient is inpatient.  This is judged to be reasonable and necessary in order to provide the required intensity of service to ensure the patient's safety given the presenting symptoms, physical exam findings and initial radiographic and laboratory data in the context of comorbid conditions.  The patient requires inpatient status due to high intensity of service, high risk of further deterioration and high frequency of surveillance required.  I certify that at the time of admission, it is my clinical judgment that the patient will require  inpatient hospital care extending more than 2 midnights.                            Dispo: The patient is from: Home              Anticipated d/c is to: Home              Patient currently is not medically stable to d/c.              Difficult to place patient: No  Madison DELENA Peaches M.D on 12/29/2023 at 12:34 AM  [facility]   From 7 PM-7 AM, contact night-coverage [email or web address]  CC: Primary care physician; [facility]

## 2023-12-29 NOTE — Assessment & Plan Note (Signed)
-   The patient will be placed on supplemental coverage with NovoLog. 

## 2023-12-29 NOTE — Assessment & Plan Note (Addendum)
-   Given his GI bleeding and hypotension we will hold off antihypertensive therapy. - Will place the patient on as needed IV hydralazine  for now.

## 2023-12-29 NOTE — Progress Notes (Addendum)
 PROGRESS NOTE   HPI was taken from Dr. Lawence: Scott Yoder. is a 68 y.o. male with medical history significant for coronary artery disease on aspirin  and Plavix , type 2 diabetes mellitus, TIA, hypertension and dyslipidemia, who presented to the emergency room with acute onset of hypotension.  Patient was at home this evening when he began to feel dizzy and hot and then lost consciousness falling back into his chair.[name]  He was having foaming in the mouth at the time with no seizure activity witnessed.  It is unclear if he hit his head.  He denies any nausea or vomiting or abdominal pain.  No heartburn or diarrhea or melena or bright red bleeding per rectum.  He was hypotensive per EMS with a BP of 72/40 upon their arrival.  He was given 500 mL IV normal saline bolus with improvement.  He denied any chest pain or palpitations.  No cough or wheezing or hemoptysis.  No dysuria, oliguria or hematuria or flank pain.  The patient had maroon-colored stool with positive stool Hemoccult by the EDP. ED Course: When he came to the ER, BP was 98/64 and later 81/55 with a heart rate of 57 otherwise normal vital signs.  BP later on was 104/73 and heart rate was 65.  Labs revealed hemoglobin of 8.5 and hematocrit 23.5 compared to 17/46.4 on 12/18/2023.  Repeat H&H was 6.9 and 19.1.  Sodium was 132 and BUN of 40 with calcium  of 8.1 and albumin 2.7, AST 48 and ALT 55 with total protein of 5.5.  PT and INR as well as PTT were normal.  Blood group was B+ with negative antibody screen. EKG as reviewed by me : EKG showed normal sinus rhythm with a rate of 56 with nonspecific intraventricular conduction delay. Imaging: CTA of the abdomen and pelvis with and without contrast revealed no evidence for active GI bleeding.  Showed severe atherosclerotic disease of the aorta and its branches and 50% stenosis in the left common iliac artery and left external iliac artery, prostatomegaly with no other acute abnormalities.   The  patient was given 40 mg of IV Protonix  and 2 L bolus of IV Ringer.  He will be admitted to a medical telemetry bed for further evaluation and management.   Scott Yoder.  FMW:[id number] DOB: 22-Apr-1956 DOA: 12/28/2023 PCP: [facility]   Assessment & Plan:   Principal Problem:   GI bleeding Active Problems:   ABLA (acute blood loss anemia)   Hypotension   Dyslipidemia   Essential hypertension   Depression   Type 2 diabetes mellitus without complications (HCC)  Assessment and Plan:  GI bleeding: etiology unclear. S/p 2 units of pRBCs transfused (as per admitting physician) so far. Holding aspirin , plavix . Plavix  needs to be held x 5 days before any procedures unless pt continues to bleed or becomes unstable as per GI. Will continue to monitor H&H. Maroon colored stool w/ positive fecal occult in the ER. GI following and recs apprec     Acute blood loss anemia: likely secondary to above. S/p 2 units of pRBCs transfused so far. Will continue to monitor H&H   Hypotension: likely secondary to GI bleeding. Continue on IVFs. Keep MAP >65   HLD: continue on statin    HTN: holding home dose of amlodipine , coreg , lisinopril    DM2: well controlled, HbA1c 5.3. No need for SSI currently    Depression: severity unknown. Continue on home dose of lexapro   Transaminitis: mild. Etiology unclear. Will continue  to monitor       DVT prophylaxis: SCDs Code Status: full  Family Communication: Disposition Plan: likely d/c back home   Status is: Inpatient Remains inpatient appropriate because: severity of illness, will possibly get a scope inpatient as per GI     Level of care: Telemetry Medical Consultants:  GI  Procedures:   Antimicrobials:   Subjective: Pt c/o fatigue   Objective: Vitals:   12/29/23 0118 12/29/23 0354 12/29/23 0404 12/29/23 0406  BP: 98/60 111/69 98/63 98/63   Pulse: (!) 59 (!) 55  (!) 53  Resp: 18 16 18 18   Temp: 97.6 F (36.4 C) 97.8 F  (36.6 C) 97.8 F (36.6 C) 97.8 F (36.6 C)  TempSrc: Oral Oral Oral   SpO2: 100% 100% 100% 100%  Weight:      Height:        Intake/Output Summary (Last 24 hours) at 12/29/2023 0824 Last data filed at 12/29/2023 0446 Gross per 24 hour  Intake 2354 ml  Output 350 ml  Net 2004 ml   Filed Weights   12/28/23 2009  Weight: 60.3 kg    Examination:  General exam: Appears calm and comfortable  Respiratory system: Clear to auscultation. Respiratory effort normal. Cardiovascular system: S1 & S2+. No rubs, gallops or clicks.. Gastrointestinal system: Abdomen is nondistended, soft and nontender. Normal bowel sounds heard. Central nervous system: Alert and oriented. Moves all extremities  Psychiatry: Judgement and insight appear normal. Flat mood and affect.     Data Reviewed: I have personally reviewed following labs and imaging studies  CBC: Recent Labs  Lab 12/28/23 2013 12/28/23 2055 12/28/23 2114 12/29/23 0538  WBC 6.6  --   --  7.9  NEUTROABS 4.0  --   --   --   HGB 8.5* 6.9* 8.6* 8.9*  HCT 23.5* 19.1* 23.5* 24.3*  MCV 92.2  --   --  89.3  PLT 227  --   --  292   Basic Metabolic Panel: Recent Labs  Lab 12/28/23 2013 12/29/23 0538  NA 132* 134*  K 3.8 3.5  CL 99 99  CO2 24 26  GLUCOSE 115* 99  BUN 40* 31*  CREATININE 0.64 0.62  CALCIUM  8.1* 8.2*  MG 2.0  --    GFR: Estimated Creatinine Clearance: 77.5 mL/min (by C-G formula based on SCr of 0.62 mg/dL). Liver Function Tests: Recent Labs  Lab 12/28/23 2013  AST 48*  ALT 55*  ALKPHOS 36*  BILITOT 0.8  PROT 5.5*  ALBUMIN 2.7*   No results for input(s): LIPASE, AMYLASE in the last 168 hours. No results for input(s): AMMONIA in the last 168 hours. Coagulation Profile: Recent Labs  Lab 12/28/23 2225  INR 1.1   Cardiac Enzymes: No results for input(s): CKTOTAL, CKMB, CKMBINDEX, TROPONINI in the last 168 hours. BNP (last 3 results) No results for input(s): PROBNP in the last 8760  hours. HbA1C: No results for input(s): HGBA1C in the last 72 hours. CBG: No results for input(s): GLUCAP in the last 168 hours. Lipid Profile: No results for input(s): CHOL, HDL, LDLCALC, TRIG, CHOLHDL, LDLDIRECT in the last 72 hours. Thyroid Function Tests: No results for input(s): TSH, T4TOTAL, FREET4, T3FREE, THYROIDAB in the last 72 hours. Anemia Panel: No results for input(s): VITAMINB12, FOLATE, FERRITIN, TIBC, IRON, RETICCTPCT in the last 72 hours. Sepsis Labs: No results for input(s): PROCALCITON, LATICACIDVEN in the last 168 hours.  No results found for this or any previous visit (from the past 240 hours).  Radiology Studies: CT Angio Abd/Pel W and/or Wo Contrast Result Date: 12/28/2023 CLINICAL DATA:  Lower GI bleeding EXAM: CTA ABDOMEN AND PELVIS WITHOUT AND WITH CONTRAST TECHNIQUE: Multidetector CT imaging of the abdomen and pelvis was performed using the standard protocol during bolus administration of intravenous contrast. Multiplanar reconstructed images and MIPs were obtained and reviewed to evaluate the vascular anatomy. RADIATION DOSE REDUCTION: This exam was performed according to the departmental dose-optimization program which includes automated exposure control, adjustment of the mA and/or kV according to patient size and/or use of iterative reconstruction technique. CONTRAST:  75mL OMNIPAQUE  IOHEXOL  350 MG/ML SOLN COMPARISON:  None Available. FINDINGS: VASCULAR Aorta: Normal caliber aorta without aneurysm, dissection, vasculitis or significant stenosis. Atherosclerotic calcifications are present. Celiac: Patent without evidence of aneurysm, dissection, vasculitis or significant stenosis. SMA: Patent without evidence of aneurysm, dissection, vasculitis or significant stenosis. Common origin with celiac axis. Renals: Both renal arteries are patent without evidence of aneurysm, dissection, vasculitis, fibromuscular dysplasia or  significant stenosis. IMA: Patent without evidence of aneurysm, dissection, vasculitis or significant stenosis. Inflow: Severe atherosclerotic disease present. There is 50% focal stenosis in the proximal left common iliac artery secondary to calcified atherosclerotic disease. There is also 50% stenosis in the mid left external iliac artery secondary to calcified and noncalcified atherosclerotic disease. Right-sided iliac arteries appear patent without significant stenosis or aneurysm. No dissection. There is also stenosis of the origin of the left internal iliac artery secondary to calcified and noncalcified atherosclerotic disease. Proximal Outflow: Bilateral common femoral and visualized portions of the superficial and profunda femoral arteries are patent without evidence of aneurysm, dissection, vasculitis or significant stenosis. Calcified atherosclerotic disease present. Veins: No obvious venous abnormality within the limitations of this arterial phase study. Review of the MIP images confirms the above findings. NON-VASCULAR Lower chest: No acute abnormality. Hepatobiliary: No focal liver abnormality is seen. No gallstones, gallbladder wall thickening, or biliary dilatation. Pancreas: Unremarkable. No pancreatic ductal dilatation or surrounding inflammatory changes. Spleen: Normal in size without focal abnormality. Adrenals/Urinary Tract: Adrenal glands are unremarkable. Kidneys are normal, without renal calculi, focal lesion, or hydronephrosis. Bladder is unremarkable. Stomach/Bowel: Stomach is within normal limits. Appendix is not seen. No evidence of bowel wall thickening, distention, or inflammatory changes. Lymphatic: Prostate gland is enlarged. Reproductive: Uterus and bilateral adnexa are unremarkable. Other: Degenerative changes affect the spine. Musculoskeletal: No fracture is seen. IMPRESSION: 1. No evidence for active GI bleeding. 2. Severe atherosclerotic disease of the aorta and its branches. 3.  50% stenosis in the left common iliac artery and left external iliac artery. 4. No acute localizing process in the abdomen or pelvis. 5. Prostatomegaly. Electronically Signed   By: Greig Pique M.D.   On: 12/28/2023 23:13   CT Head Wo Contrast Result Date: 12/28/2023 CLINICAL DATA:  Seizure, new-onset, no history of trauma EXAM: CT HEAD WITHOUT CONTRAST TECHNIQUE: Contiguous axial images were obtained from the base of the skull through the vertex without intravenous contrast. RADIATION DOSE REDUCTION: This exam was performed according to the departmental dose-optimization program which includes automated exposure control, adjustment of the mA and/or kV according to patient size and/or use of iterative reconstruction technique. COMPARISON:  MRI head 12/11/2023 FINDINGS: Brain: Right temporal and left cerebellar lobe encephalomalacia. Patchy and confluent areas of decreased attenuation are noted throughout the deep and periventricular white matter of the cerebral hemispheres bilaterally, compatible with chronic microvascular ischemic disease. No evidence of large-territorial acute infarction. No parenchymal hemorrhage. No mass lesion. No extra-axial collection. No mass effect or  midline shift. No hydrocephalus. Basilar cisterns are patent. Vascular: No hyperdense vessel. Atherosclerotic calcifications are present within the cavernous internal carotid and vertebral arteries. Skull: No acute fracture or focal lesion. Sinuses/Orbits: Paranasal sinuses and mastoid air cells are clear. The orbits are unremarkable. Other: None. IMPRESSION: No acute intracranial abnormality. Electronically Signed   By: Morgane  Naveau M.D.   On: 12/28/2023 20:30        Scheduled Meds:  atorvastatin   40 mg Oral Daily   brimonidine   1 drop Both Eyes BID   escitalopram   10 mg Oral Daily   latanoprost   1 drop Both Eyes QHS   pantoprazole  (PROTONIX ) IV  40 mg Intravenous Q12H   Continuous Infusions:  sodium chloride      sodium  chloride 100 mL/hr at 12/29/23 0407     LOS: 1 day       Anthony CHRISTELLA Pouch, MD [facility] Pager [phone]  If 7PM-7AM, please contact night-coverage [email or web address] 12/29/2023, 8:24 AM

## 2023-12-29 NOTE — Assessment & Plan Note (Signed)
 Will continue statin therapy

## 2023-12-29 NOTE — Assessment & Plan Note (Signed)
-   This is likely secondary to volume depletion associated with GI bleeding. - Management as above. - Will continue hydration with IV normal saline.

## 2023-12-29 NOTE — Assessment & Plan Note (Signed)
-   This is secondary to #1. - Management as above.

## 2023-12-29 NOTE — Assessment & Plan Note (Addendum)
-   The patient will be admitted to a medical telemetry bed. - Will follow serial hemoglobins and hematocrits. - Will hold off aspirin  and Plavix . - He was typed and crossmatch and will be transfused 2 units of packed red blood cells. - We will follow posttransfusion H&H. - Will place the patient on IV PPI therapy with Protonix  - GI consult will be obtained. - I notified Dr. Unk about the patient

## 2023-12-29 NOTE — Assessment & Plan Note (Signed)
-   Will continue Lexapro. 

## 2023-12-29 NOTE — Consult Note (Signed)
 Scott JONELLE Brooklyn, MD [address]  [place], [place] [address]  Main: [phone] Fax:  [phone] Pager: [phone]   Consultation  Referring Provider:     No ref. provider found Primary Care Physician:  [facility] Primary Gastroenterologist: Sampson         Reason for Consultation:     GI bleed  Date of Admission:  12/28/2023 Date of Consultation:  12/29/2023         HPI:   Scott Yoder. is a 68 y.o. male with history of cocaine use, coronary artery disease, prior history of stroke, TIA on aspirin  and Plavix , diabetes presented with severe symptomatic anemia.  He has been passing maroon-colored stools, without any abdominal pain, nausea or vomiting.  He was found to have hemoglobin of 8.5, further dropped to 6.9 compared to 17 which was 11 days ago.  He underwent CT angio GI bleed protocol which did not reveal any active extravasation.  Elevated BUN/creatinine 40/0.64.  He underwent blood transfusion and hemoglobin this morning was 8.9.  Platelets 292.  He is on Protonix  40 mg IV twice daily and kept NPO.  Plavix  has been held since today Patient reports that he has been drinking until about a month ago. He is having his lunch, did not have any bowel movements today, does not have any concerns   NSAIDs: None  Antiplts/Anticoagulants/Anti thrombotics: Aspirin  and Plavix   GI Procedures: None  Past Medical History:  Diagnosis Date   Coronary artery disease    Dyslipidemia 12/29/2023   Hernia, inguinal    High cholesterol    Hypertension    Hypokalemia     Past Surgical History:  Procedure Laterality Date   CORONARY ARTERY BYPASS GRAFT     TRACHEOSTOMY       Current Facility-Administered Medications:    0.9 %  sodium chloride  infusion, 10 mL/hr, Intravenous, Once, Willo Dunnings, MD   0.9 %  sodium chloride  infusion, , Intravenous, Continuous, Mansy, Jan A, MD, Last Rate: 100 mL/hr at 12/29/23 0407, New Bag at 12/29/23 0407   acetaminophen   (TYLENOL ) tablet 650 mg, 650 mg, Oral, Q6H PRN **OR** acetaminophen  (TYLENOL ) suppository 650 mg, 650 mg, Rectal, Q6H PRN, Mansy, Jan A, MD   atorvastatin  (LIPITOR) tablet 40 mg, 40 mg, Oral, Daily, Mansy, Jan A, MD, 40 mg at 12/29/23 1059   brimonidine  (ALPHAGAN ) 0.2 % ophthalmic solution 1 drop, 1 drop, Both Eyes, BID, Mansy, Jan A, MD, 1 drop at 12/29/23 1100   cyclobenzaprine  (FLEXERIL ) tablet 10 mg, 10 mg, Oral, TID PRN, Mansy, Jan A, MD   escitalopram  (LEXAPRO ) tablet 10 mg, 10 mg, Oral, Daily, Mansy, Jan A, MD, 10 mg at 12/29/23 1059   fluticasone  (FLONASE ) 50 MCG/ACT nasal spray 2 spray, 2 spray, Each Nare, Daily PRN, Mansy, Jan A, MD   hydrocortisone  cream 1 % 1 Application, 1 Application, Topical, BID PRN, Mansy, Jan A, MD   latanoprost  (XALATAN ) 0.005 % ophthalmic solution 1 drop, 1 drop, Both Eyes, QHS, Mansy, Jan A, MD   nitroGLYCERIN  (NITROSTAT ) SL tablet 0.4 mg, 0.4 mg, Sublingual, TID PRN, Mansy, Jan A, MD   ondansetron  (ZOFRAN ) tablet 4 mg, 4 mg, Oral, Q6H PRN **OR** ondansetron  (ZOFRAN ) injection 4 mg, 4 mg, Intravenous, Q6H PRN, Mansy, Jan A, MD   oxyCODONE  (Oxy IR/ROXICODONE ) immediate release tablet 5-10 mg, 5-10 mg, Oral, Q6H PRN, Mansy, Jan A, MD   pantoprazole  (PROTONIX ) injection 40 mg, 40 mg, Intravenous, Q12H, Mansy, Jan A, MD, 40 mg at 12/29/23  1059   traZODone  (DESYREL ) tablet 25 mg, 25 mg, Oral, QHS PRN, Mansy, Madison LABOR, MD    Family History  Problem Relation Age of Onset   Diabetes Mother    Hypertension Mother    Stroke Mother      Social History   Tobacco Use   Smoking status: Never   Smokeless tobacco: Never  Vaping Use   Vaping status: Never Used  Substance Use Topics   Alcohol use: Yes    Comment: 40 every 4 days   Drug use: Yes    Types: Marijuana    Comment: every three days    Allergies as of 12/28/2023   (No Known Allergies)    Review of Systems:    All systems reviewed and negative except where noted in HPI.   Physical Exam:  Vital  signs in last 24 hours: Temp:  [97.5 F (36.4 C)-98.4 F (36.9 C)] 98.1 F (36.7 C) (08/31 1158) Pulse Rate:  [53-68] 54 (08/31 1158) Resp:  [14-20] 18 (08/31 1158) BP: (81-111)/(55-73) 94/63 (08/31 1158) SpO2:  [100 %] 100 % (08/31 1158) Weight:  [60.3 kg] 60.3 kg (08/30 2009) Last BM Date : 12/27/23 General:   Alert,  Well-developed, well-nourished, pleasant and cooperative in NAD Eyes:  Sclera clear, no icterus.   Conjunctiva pink. Lungs:  Respirations even and unlabored.  Clear throughout to auscultation.   No wheezes, crackles, or rhonchi. No acute distress. Heart:  Regular rate and rhythm; no murmurs, clicks, rubs, or gallops. Abdomen:  Normal bowel sounds. Soft, non-tender and non-distended without masses, hepatosplenomegaly or hernias noted.  No guarding or rebound tenderness.   Rectal: Not performed Extremities:  No clubbing or edema.  No cyanosis. Neurologic:  Alert and oriented x3 Skin:  Intact without significant lesions or rashes. No jaundice. Psych:  Alert and cooperative. Normal mood and affect.  LAB RESULTS:    Latest Ref Rng & Units 12/29/2023   10:51 AM 12/29/2023    5:38 AM 12/28/2023    9:14 PM  CBC  WBC 4.0 - 10.5 K/uL  7.9    Hemoglobin 13.0 - 17.0 g/dL 9.0  8.9  8.6   Hematocrit 39.0 - 52.0 % 25.4  24.3  23.5   Platelets 150 - 400 K/uL  292      BMET    Latest Ref Rng & Units 12/29/2023    5:38 AM 12/28/2023    8:13 PM 12/18/2023    5:26 AM  BMP  Glucose 70 - 99 mg/dL 99  884  886   BUN 8 - 23 mg/dL 31  40  23   Creatinine 0.61 - 1.24 mg/dL 9.37  9.35  9.22   Sodium 135 - 145 mmol/L 134  132  130   Potassium 3.5 - 5.1 mmol/L 3.5  3.8  3.3   Chloride 98 - 111 mmol/L 99  99  91   CO2 22 - 32 mmol/L 26  24  27    Calcium  8.9 - 10.3 mg/dL 8.2  8.1  9.4     LFT    Latest Ref Rng & Units 12/28/2023    8:13 PM 12/11/2023   11:51 AM 12/10/2023    4:25 PM  Hepatic Function  Total Protein 6.5 - 8.1 g/dL 5.5  9.2  8.0   Albumin 3.5 - 5.0 g/dL 2.7  4.4   3.8   AST 15 - 41 U/L 48  50  40   ALT 0 - 44 U/L 55  48  41   Alk Phosphatase 38 - 126 U/L 36  77  75   Total Bilirubin 0.0 - 1.2 mg/dL 0.8  1.6  1.1      STUDIES: CT Angio Abd/Pel W and/or Wo Contrast Result Date: 12/28/2023 CLINICAL DATA:  Lower GI bleeding EXAM: CTA ABDOMEN AND PELVIS WITHOUT AND WITH CONTRAST TECHNIQUE: Multidetector CT imaging of the abdomen and pelvis was performed using the standard protocol during bolus administration of intravenous contrast. Multiplanar reconstructed images and MIPs were obtained and reviewed to evaluate the vascular anatomy. RADIATION DOSE REDUCTION: This exam was performed according to the departmental dose-optimization program which includes automated exposure control, adjustment of the mA and/or kV according to patient size and/or use of iterative reconstruction technique. CONTRAST:  75mL OMNIPAQUE  IOHEXOL  350 MG/ML SOLN COMPARISON:  None Available. FINDINGS: VASCULAR Aorta: Normal caliber aorta without aneurysm, dissection, vasculitis or significant stenosis. Atherosclerotic calcifications are present. Celiac: Patent without evidence of aneurysm, dissection, vasculitis or significant stenosis. SMA: Patent without evidence of aneurysm, dissection, vasculitis or significant stenosis. Common origin with celiac axis. Renals: Both renal arteries are patent without evidence of aneurysm, dissection, vasculitis, fibromuscular dysplasia or significant stenosis. IMA: Patent without evidence of aneurysm, dissection, vasculitis or significant stenosis. Inflow: Severe atherosclerotic disease present. There is 50% focal stenosis in the proximal left common iliac artery secondary to calcified atherosclerotic disease. There is also 50% stenosis in the mid left external iliac artery secondary to calcified and noncalcified atherosclerotic disease. Right-sided iliac arteries appear patent without significant stenosis or aneurysm. No dissection. There is also stenosis of the  origin of the left internal iliac artery secondary to calcified and noncalcified atherosclerotic disease. Proximal Outflow: Bilateral common femoral and visualized portions of the superficial and profunda femoral arteries are patent without evidence of aneurysm, dissection, vasculitis or significant stenosis. Calcified atherosclerotic disease present. Veins: No obvious venous abnormality within the limitations of this arterial phase study. Review of the MIP images confirms the above findings. NON-VASCULAR Lower chest: No acute abnormality. Hepatobiliary: No focal liver abnormality is seen. No gallstones, gallbladder wall thickening, or biliary dilatation. Pancreas: Unremarkable. No pancreatic ductal dilatation or surrounding inflammatory changes. Spleen: Normal in size without focal abnormality. Adrenals/Urinary Tract: Adrenal glands are unremarkable. Kidneys are normal, without renal calculi, focal lesion, or hydronephrosis. Bladder is unremarkable. Stomach/Bowel: Stomach is within normal limits. Appendix is not seen. No evidence of bowel wall thickening, distention, or inflammatory changes. Lymphatic: Prostate gland is enlarged. Reproductive: Uterus and bilateral adnexa are unremarkable. Other: Degenerative changes affect the spine. Musculoskeletal: No fracture is seen. IMPRESSION: 1. No evidence for active GI bleeding. 2. Severe atherosclerotic disease of the aorta and its branches. 3. 50% stenosis in the left common iliac artery and left external iliac artery. 4. No acute localizing process in the abdomen or pelvis. 5. Prostatomegaly. Electronically Signed   By: Greig Pique M.D.   On: 12/28/2023 23:13   CT Head Wo Contrast Result Date: 12/28/2023 CLINICAL DATA:  Seizure, new-onset, no history of trauma EXAM: CT HEAD WITHOUT CONTRAST TECHNIQUE: Contiguous axial images were obtained from the base of the skull through the vertex without intravenous contrast. RADIATION DOSE REDUCTION: This exam was performed  according to the departmental dose-optimization program which includes automated exposure control, adjustment of the mA and/or kV according to patient size and/or use of iterative reconstruction technique. COMPARISON:  MRI head 12/11/2023 FINDINGS: Brain: Right temporal and left cerebellar lobe encephalomalacia. Patchy and confluent areas of decreased attenuation are noted throughout the deep and periventricular white  matter of the cerebral hemispheres bilaterally, compatible with chronic microvascular ischemic disease. No evidence of large-territorial acute infarction. No parenchymal hemorrhage. No mass lesion. No extra-axial collection. No mass effect or midline shift. No hydrocephalus. Basilar cisterns are patent. Vascular: No hyperdense vessel. Atherosclerotic calcifications are present within the cavernous internal carotid and vertebral arteries. Skull: No acute fracture or focal lesion. Sinuses/Orbits: Paranasal sinuses and mastoid air cells are clear. The orbits are unremarkable. Other: None. IMPRESSION: No acute intracranial abnormality. Electronically Signed   By: Morgane  Naveau M.D.   On: 12/28/2023 20:30      Impression / Plan:   Scott Yoder. is a 68 y.o. male with history of cocaine use, coronary artery disease, prior history of stroke, TIA on aspirin  and Plavix  is admitted with hematochezia, acute blood loss anemia  Hematochezia with acute blood loss anemia Mildly elevated BUN/creatinine concerning for upper GI bleed in setting of DAPT, which is improving Continue hydration Maintain 2 large-bore IVs Plavix  has been held since 8/31, antiplatelet therapy needs to be interrupted for 3 to 5 days before proceeding with endoscopic evaluation Patient is hemodynamically stable at this time, no emergent or urgent need for endoscopic intervention Continue pantoprazole  40 mg IV twice daily Check iron panel, B12 and folate levels, replete as needed Monitor CBC closely to maintain  hemoglobin above 8  Thank you for involving me in the care of this patient.  GI will follow along with you    LOS: 1 day   Scott Brooklyn, MD  12/29/2023, 12:31 PM    Note: This dictation was prepared with Dragon dictation along with smaller phrase technology. Any transcriptional errors that result from this process are unintentional.

## 2023-12-30 DIAGNOSIS — K922 Gastrointestinal hemorrhage, unspecified: Secondary | ICD-10-CM | POA: Diagnosis not present

## 2023-12-30 LAB — TYPE AND SCREEN
ABO/RH(D): B POS
Antibody Screen: NEGATIVE
Unit division: 0

## 2023-12-30 LAB — COMPREHENSIVE METABOLIC PANEL WITH GFR
ALT: 57 U/L — ABNORMAL HIGH (ref 0–44)
AST: 50 U/L — ABNORMAL HIGH (ref 15–41)
Albumin: 2.4 g/dL — ABNORMAL LOW (ref 3.5–5.0)
Alkaline Phosphatase: 37 U/L — ABNORMAL LOW (ref 38–126)
Anion gap: 7 (ref 5–15)
BUN: 19 mg/dL (ref 8–23)
CO2: 22 mmol/L (ref 22–32)
Calcium: 7.9 mg/dL — ABNORMAL LOW (ref 8.9–10.3)
Chloride: 104 mmol/L (ref 98–111)
Creatinine, Ser: 0.66 mg/dL (ref 0.61–1.24)
GFR, Estimated: >60 mL/min (ref >60)
Glucose, Bld: 89 mg/dL (ref 70–99)
Potassium: 3.7 mmol/L (ref 3.5–5.1)
Sodium: 133 mmol/L — ABNORMAL LOW (ref 135–145)
Total Bilirubin: 0.6 mg/dL (ref 0.0–1.2)
Total Protein: 4.9 g/dL — ABNORMAL LOW (ref 6.5–8.1)

## 2023-12-30 LAB — BPAM RBC
Blood Product Expiration Date: 202509282359
ISSUE DATE / TIME: 202508310054
Unit Type and Rh: 202509282359
Unit Type and Rh: 5100

## 2023-12-30 LAB — CBC
HCT: 25.6 % — ABNORMAL LOW (ref 39.0–52.0)
Hemoglobin: 9.2 g/dL — ABNORMAL LOW (ref 13.0–17.0)
MCH: 32.5 pg (ref 26.0–34.0)
MCHC: 35.9 g/dL (ref 30.0–36.0)
MCV: 90.5 fL (ref 80.0–100.0)
Platelets: 218 K/uL (ref 150–400)
RBC: 2.83 MIL/uL — ABNORMAL LOW (ref 4.22–5.81)
RDW: 13.9 % (ref 11.5–15.5)
WBC: 7.1 K/uL (ref 4.0–10.5)
nRBC: 0 % (ref 0.0–0.2)

## 2023-12-30 LAB — HEMOGLOBIN: Hemoglobin: 10.1 g/dL — ABNORMAL LOW (ref 13.0–17.0)

## 2023-12-30 MED ORDER — POLYETHYLENE GLYCOL 3350 17 G PO PACK
17.0000 g | PACK | Freq: Two times a day (BID) | ORAL | Status: DC
  Administered 2023-12-30 – 2023-12-31 (×2): 17 g via ORAL
  Filled 2023-12-30 (×3): qty 1

## 2023-12-30 MED ORDER — ACETAMINOPHEN 500 MG PO TABS: 1000.0000 mg | ORAL_TABLET | Freq: Three times a day (TID) | ORAL | Status: DC | PRN

## 2023-12-30 NOTE — Plan of Care (Signed)

## 2023-12-30 NOTE — Progress Notes (Signed)
 Scott JONELLE Brooklyn, MD [address]  [place], [place] [address]  Main: [phone] Fax:  [phone] Pager: [phone]   Subjective: No acute events overnight, patient is having lunch, denies any symptoms.  Reports feeling constipated   Objective: Vital signs in last 24 hours: Vitals:   12/29/23 2038 12/30/23 0520 12/30/23 0715 12/30/23 1454  BP: 103/67 119/82 125/80 102/63  Pulse: (!) 55 (!) 58 (!) 53 66  Resp: 16 16 17 19   Temp: 98.7 F (37.1 C) 98.6 F (37 C) 97.8 F (36.6 C) 99.1 F (37.3 C)  TempSrc: Oral Oral Oral Oral  SpO2: 100% 100% 98% 100%  Weight:      Height:       Weight change:   Intake/Output Summary (Last 24 hours) at 12/30/2023 1557 Last data filed at 12/30/2023 1408 Gross per 24 hour  Intake 2575.66 ml  Output 800 ml  Net 1775.66 ml     Exam: Heart:: Regular rate and rhythm or S1S2 present Lungs: normal Abdomen: soft, nontender, normal bowel sounds   Lab Results:    Latest Ref Rng & Units 12/30/2023    5:14 AM 12/29/2023   10:40 PM 12/29/2023    5:11 PM  CBC  WBC 4.0 - 10.5 K/uL 7.1     Hemoglobin 13.0 - 17.0 g/dL 9.2  7.2  9.2   Hematocrit 39.0 - 52.0 % 25.6  20.2  25.4   Platelets 150 - 400 K/uL 218         Latest Ref Rng & Units 12/30/2023    5:14 AM 12/29/2023    5:38 AM 12/28/2023    8:13 PM  CMP  Glucose 70 - 99 mg/dL 89  99  884   BUN 8 - 23 mg/dL 19  31  40   Creatinine 0.61 - 1.24 mg/dL 9.33  9.37  9.35   Sodium 135 - 145 mmol/L 133  134  132   Potassium 3.5 - 5.1 mmol/L 3.7  3.5  3.8   Chloride 98 - 111 mmol/L 104  99  99   CO2 22 - 32 mmol/L 22  26  24    Calcium  8.9 - 10.3 mg/dL 7.9  8.2  8.1   Total Protein 6.5 - 8.1 g/dL 4.9   5.5   Total Bilirubin 0.0 - 1.2 mg/dL 0.6   0.8   Alkaline Phos 38 - 126 U/L 37   36   AST 15 - 41 U/L 50   48   ALT 0 - 44 U/L 57   55     Micro Results: No results found for this or any previous visit (from the past 240 hours). Studies/Results: CT Angio Abd/Pel W and/or Wo  Contrast Result Date: 12/28/2023 CLINICAL DATA:  Lower GI bleeding EXAM: CTA ABDOMEN AND PELVIS WITHOUT AND WITH CONTRAST TECHNIQUE: Multidetector CT imaging of the abdomen and pelvis was performed using the standard protocol during bolus administration of intravenous contrast. Multiplanar reconstructed images and MIPs were obtained and reviewed to evaluate the vascular anatomy. RADIATION DOSE REDUCTION: This exam was performed according to the departmental dose-optimization program which includes automated exposure control, adjustment of the mA and/or kV according to patient size and/or use of iterative reconstruction technique. CONTRAST:  75mL OMNIPAQUE  IOHEXOL  350 MG/ML SOLN COMPARISON:  None Available. FINDINGS: VASCULAR Aorta: Normal caliber aorta without aneurysm, dissection, vasculitis or significant stenosis. Atherosclerotic calcifications are present. Celiac: Patent without evidence of aneurysm, dissection, vasculitis or significant stenosis. SMA: Patent without evidence of aneurysm,  dissection, vasculitis or significant stenosis. Common origin with celiac axis. Renals: Both renal arteries are patent without evidence of aneurysm, dissection, vasculitis, fibromuscular dysplasia or significant stenosis. IMA: Patent without evidence of aneurysm, dissection, vasculitis or significant stenosis. Inflow: Severe atherosclerotic disease present. There is 50% focal stenosis in the proximal left common iliac artery secondary to calcified atherosclerotic disease. There is also 50% stenosis in the mid left external iliac artery secondary to calcified and noncalcified atherosclerotic disease. Right-sided iliac arteries appear patent without significant stenosis or aneurysm. No dissection. There is also stenosis of the origin of the left internal iliac artery secondary to calcified and noncalcified atherosclerotic disease. Proximal Outflow: Bilateral common femoral and visualized portions of the superficial and profunda  femoral arteries are patent without evidence of aneurysm, dissection, vasculitis or significant stenosis. Calcified atherosclerotic disease present. Veins: No obvious venous abnormality within the limitations of this arterial phase study. Review of the MIP images confirms the above findings. NON-VASCULAR Lower chest: No acute abnormality. Hepatobiliary: No focal liver abnormality is seen. No gallstones, gallbladder wall thickening, or biliary dilatation. Pancreas: Unremarkable. No pancreatic ductal dilatation or surrounding inflammatory changes. Spleen: Normal in size without focal abnormality. Adrenals/Urinary Tract: Adrenal glands are unremarkable. Kidneys are normal, without renal calculi, focal lesion, or hydronephrosis. Bladder is unremarkable. Stomach/Bowel: Stomach is within normal limits. Appendix is not seen. No evidence of bowel wall thickening, distention, or inflammatory changes. Lymphatic: Prostate gland is enlarged. Reproductive: Uterus and bilateral adnexa are unremarkable. Other: Degenerative changes affect the spine. Musculoskeletal: No fracture is seen. IMPRESSION: 1. No evidence for active GI bleeding. 2. Severe atherosclerotic disease of the aorta and its branches. 3. 50% stenosis in the left common iliac artery and left external iliac artery. 4. No acute localizing process in the abdomen or pelvis. 5. Prostatomegaly. Electronically Signed   By: Greig Pique M.D.   On: 12/28/2023 23:13   CT Head Wo Contrast Result Date: 12/28/2023 CLINICAL DATA:  Seizure, new-onset, no history of trauma EXAM: CT HEAD WITHOUT CONTRAST TECHNIQUE: Contiguous axial images were obtained from the base of the skull through the vertex without intravenous contrast. RADIATION DOSE REDUCTION: This exam was performed according to the departmental dose-optimization program which includes automated exposure control, adjustment of the mA and/or kV according to patient size and/or use of iterative reconstruction technique.  COMPARISON:  MRI head 12/11/2023 FINDINGS: Brain: Right temporal and left cerebellar lobe encephalomalacia. Patchy and confluent areas of decreased attenuation are noted throughout the deep and periventricular white matter of the cerebral hemispheres bilaterally, compatible with chronic microvascular ischemic disease. No evidence of large-territorial acute infarction. No parenchymal hemorrhage. No mass lesion. No extra-axial collection. No mass effect or midline shift. No hydrocephalus. Basilar cisterns are patent. Vascular: No hyperdense vessel. Atherosclerotic calcifications are present within the cavernous internal carotid and vertebral arteries. Skull: No acute fracture or focal lesion. Sinuses/Orbits: Paranasal sinuses and mastoid air cells are clear. The orbits are unremarkable. Other: None. IMPRESSION: No acute intracranial abnormality. Electronically Signed   By: Morgane  Naveau M.D.   On: 12/28/2023 20:30   Medications: I have reviewed the patient's current medications. Prior to Admission:  Medications Prior to Admission  Medication Sig Dispense Refill Last Dose/Taking   amLODipine  (NORVASC ) 10 MG tablet Take 1 tablet (10 mg total) by mouth daily. 30 tablet 0    aspirin  81 MG tablet Take 81 mg by mouth daily.      atorvastatin  (LIPITOR) 40 MG tablet Take 1 tablet (40 mg total) by mouth daily. 30  tablet 0    brimonidine  (ALPHAGAN ) 0.15 % ophthalmic solution Place 1 drop into both eyes 3 (three) times daily. 5 mL 0    carvedilol  (COREG ) 25 MG tablet Take 2 tablets by mouth 2 (two) times daily.      clopidogrel  (PLAVIX ) 75 MG tablet Take 1 tablet (75 mg total) by mouth daily. 30 tablet 0    cyclobenzaprine  (FLEXERIL ) 10 MG tablet Take 10 mg by mouth 3 (three) times daily as needed.      escitalopram  (LEXAPRO ) 10 MG tablet Take 1 tablet (10 mg total) by mouth daily. 30 tablet 0    fluticasone  (FLONASE ) 50 MCG/ACT nasal spray Place 2 sprays into both nostrils daily. 16 g 0    hydrocortisone  2.5 %  ointment Apply topically 2 (two) times daily. 30 g 0    lactulose  (CHRONULAC ) 10 GM/15ML solution Take 45 mLs (30 g total) by mouth 3 (three) times daily. 236 mL 0    latanoprost  (XALATAN ) 0.005 % ophthalmic solution Place 1 drop into both eyes at bedtime.      lisinopril  (ZESTRIL ) 20 MG tablet Take 1 tablet (20 mg total) by mouth daily. 30 tablet 0    nitroGLYCERIN  (NITROSTAT ) 0.4 MG SL tablet Place 0.4 mg under the tongue 3 (three) times daily as needed. 1 tab every five minutes x 3, as needed x chest pain      oxyCODONE  (OXY IR/ROXICODONE ) 5 MG immediate release tablet Take 1-2 tablets (5-10 mg total) by mouth every 6 (six) hours as needed for severe pain (pain score 7-10). 5 tablet 0    senna-docusate (SENOKOT-S) 8.6-50 MG tablet Take 2 tablets by mouth at bedtime as needed for mild constipation.      Scheduled:  atorvastatin   40 mg Oral Daily   brimonidine   1 drop Both Eyes BID   escitalopram   10 mg Oral Daily   lactulose   20 g Oral BID   latanoprost   1 drop Both Eyes QHS   pantoprazole  (PROTONIX ) IV  40 mg Intravenous Q12H   polyethylene glycol  17 g Oral BID   Continuous:  sodium chloride      PRN:acetaminophen  **OR** acetaminophen , cyclobenzaprine , fluticasone , hydrocortisone  cream, nitroGLYCERIN , ondansetron  **OR** ondansetron  (ZOFRAN ) IV, oxyCODONE , traZODone  Anti-infectives (From admission, onward)    None      Scheduled Meds:  atorvastatin   40 mg Oral Daily   brimonidine   1 drop Both Eyes BID   escitalopram   10 mg Oral Daily   lactulose   20 g Oral BID   latanoprost   1 drop Both Eyes QHS   pantoprazole  (PROTONIX ) IV  40 mg Intravenous Q12H   polyethylene glycol  17 g Oral BID   Continuous Infusions:  sodium chloride      PRN Meds:.acetaminophen  **OR** acetaminophen , cyclobenzaprine , fluticasone , hydrocortisone  cream, nitroGLYCERIN , ondansetron  **OR** ondansetron  (ZOFRAN ) IV, oxyCODONE , traZODone    Assessment: Principal Problem:   GI bleeding Active Problems:    ABLA (acute blood loss anemia)   Essential hypertension   Dyslipidemia   Depression   Hypotension   Type 2 diabetes mellitus without complications (HCC)  Scott Yoder. is a 68 y.o. male with history of cocaine use, coronary artery disease, prior history of stroke, TIA on aspirin  and Plavix  is admitted with hematochezia, acute blood loss anemia   Plan: Hematochezia with acute blood loss anemia, status post 1 unit of PRBCs and responded appropriately Mildly elevated BUN/creatinine concerning for upper GI bleed in setting of DAPT, which is improving Continue hydration Maintain 2 large-bore IVs Plavix  has  been held since 8/31, antiplatelet therapy needs to be interrupted for 3 to 5 days before proceeding with endoscopic evaluation Patient is hemodynamically stable at this time, no emergent or urgent need for endoscopic intervention Continue pantoprazole  40 mg IV twice daily iron panel, B12 and folate levels were normal Monitor CBC closely to maintain hemoglobin above 8 Given his age, also recommend colonoscopy along with upper endoscopy for further evaluation, tentative plan to perform upper endoscopy and colonoscopy on Wednesday Recommend clear liquid diet starting tomorrow    Thank you for involving me in the care of this patient.  Dr. Aundria will cover from tomorrow   LOS: 2 days   Yogi Arther 12/30/2023, 3:57 PM

## 2023-12-30 NOTE — Progress Notes (Signed)
 PROGRESS NOTE    Scott Yoder.  FMW:[id number] DOB: 02/16/1956 DOA: 12/28/2023 PCP: [facility]  218A/218A-AA  LOS: 2 days   Brief hospital course:   Assessment & Plan: Scott Yoder. is a 68 y.o. male with medical history significant for coronary artery disease on aspirin  and Plavix , type 2 diabetes mellitus, TIA, hypertension and dyslipidemia, who presented to the emergency room with acute onset of hypotension.  Patient was at home this evening when he began to feel dizzy and hot and then lost consciousness falling back into his chair.[name]  He was having foaming in the mouth at the time with no seizure activity witnessed.  It is unclear if he hit his head.  He denies any nausea or vomiting or abdominal pain.  No heartburn or diarrhea or melena or bright red bleeding per rectum.  He was hypotensive per EMS with a BP of 72/40 upon their arrival.  He was given 500 mL IV normal saline bolus with improvement.  He denied any chest pain or palpitations.  No cough or wheezing or hemoptysis.  No dysuria, oliguria or hematuria or flank pain.  The patient had maroon-colored stool with positive stool Hemoccult by the EDP.   GI bleeding:  etiology unclear. S/p 2 units of pRBCs transfused (as per admitting physician) so far.  Plavix  needs to be held x 5 days before any procedures unless pt continues to bleed or becomes unstable as per GI.  --hold asa and plavix  --procedures per GI   Acute blood loss anemia:  likely secondary to above.  --Hgb 8.5 on presentation, dropped to 6.9 after IVF.  Hgb was 17 on 12/18/23. S/p 2 units of pRBCs transfused so far.  --monitor Hgb and transfuse to keep Hgb >7   Hypotension:  likely secondary to GI bleeding.  S/p 2L IVF in the ED, with BP wnl now. --hold home antihypertensives   HLD:  continue on statin    HTN, not currently active --hold home antihypertensives   DM2:  well controlled, HbA1c 5.3. No need for SSI currently     Depression: severity unknown.  Continue on home dose of lexapro    Transaminitis: mild.  Etiology unclear. Will continue to monitor   Hyponatremia --Na stable in low 130's --monitor    DVT prophylaxis: SCD/Compression stockings Code Status: Full code  Family Communication:  Level of care: Telemetry Medical Dispo:   The patient is from: home Anticipated d/c is to: home Anticipated d/c date is: 2-3 days   Subjective and Interval History:  Pt denied pain, just guinea and wanted to eat solid food.   Objective: Vitals:   12/29/23 2038 12/30/23 0520 12/30/23 0715 12/30/23 1454  BP: 103/67 119/82 125/80 102/63  Pulse: (!) 55 (!) 58 (!) 53 66  Resp: 16 16 17 19   Temp: 98.7 F (37.1 C) 98.6 F (37 C) 97.8 F (36.6 C) 99.1 F (37.3 C)  TempSrc: Oral Oral Oral Oral  SpO2: 100% 100% 98% 100%  Weight:      Height:        Intake/Output Summary (Last 24 hours) at 12/30/2023 1812 Last data filed at 12/30/2023 1408 Gross per 24 hour  Intake 1391.72 ml  Output 800 ml  Net 591.72 ml   Filed Weights   12/28/23 2009  Weight: 60.3 kg    Examination:   Constitutional: NAD, AAOx3 HEENT: conjunctivae and lids normal, EOMI CV: No cyanosis.   RESP: normal respiratory effort, on RA Neuro: II - XII grossly intact.  Psych: flat mood and affect.     Data Reviewed: I have personally reviewed labs and imaging studies  Time spent: 50 minutes  Scott Haber, MD [facility] If 7PM-7AM, please contact night-coverage 12/30/2023, 6:12 PM

## 2023-12-30 NOTE — Plan of Care (Signed)
  Problem: Education: Goal: Knowledge of General Education information will improve Description: Including pain rating scale, medication(s)/side effects and non-pharmacologic comfort measures Outcome: Progressing   Problem: Clinical Measurements: Goal: Ability to maintain clinical measurements within normal limits will improve Outcome: Progressing   Problem: Clinical Measurements: Goal: Will remain free from infection Outcome: Progressing   Problem: Clinical Measurements: Goal: Diagnostic test results will improve Outcome: Progressing   Problem: Clinical Measurements: Goal: Cardiovascular complication will be avoided Outcome: Progressing   Problem: Activity: Goal: Risk for activity intolerance will decrease Outcome: Progressing   Problem: Elimination: Goal: Will not experience complications related to bowel motility Outcome: Progressing   Problem: Safety: Goal: Ability to remain free from injury will improve Outcome: Progressing   Problem: Skin Integrity: Goal: Risk for impaired skin integrity will decrease Outcome: Progressing

## 2023-12-31 DIAGNOSIS — K922 Gastrointestinal hemorrhage, unspecified: Secondary | ICD-10-CM | POA: Diagnosis not present

## 2023-12-31 DIAGNOSIS — I10 Essential (primary) hypertension: Secondary | ICD-10-CM | POA: Diagnosis not present

## 2023-12-31 DIAGNOSIS — R531 Weakness: Secondary | ICD-10-CM | POA: Diagnosis not present

## 2023-12-31 DIAGNOSIS — I69322 Dysarthria following cerebral infarction: Secondary | ICD-10-CM | POA: Diagnosis not present

## 2023-12-31 DIAGNOSIS — E876 Hypokalemia: Secondary | ICD-10-CM | POA: Diagnosis not present

## 2023-12-31 DIAGNOSIS — I2510 Atherosclerotic heart disease of native coronary artery without angina pectoris: Secondary | ICD-10-CM | POA: Diagnosis not present

## 2023-12-31 DIAGNOSIS — I69398 Other sequelae of cerebral infarction: Secondary | ICD-10-CM | POA: Diagnosis not present

## 2023-12-31 DIAGNOSIS — E46 Unspecified protein-calorie malnutrition: Secondary | ICD-10-CM | POA: Diagnosis not present

## 2023-12-31 LAB — HEMOGLOBIN: Hemoglobin: 9.4 g/dL — ABNORMAL LOW (ref 13.0–17.0)

## 2023-12-31 MED ORDER — TRAMADOL HCL 50 MG PO TABS
50.0000 mg | ORAL_TABLET | Freq: Three times a day (TID) | ORAL | Status: DC | PRN
  Administered 2023-12-31 – 2024-01-01 (×3): 50 mg via ORAL
  Filled 2023-12-31 (×3): qty 1

## 2023-12-31 NOTE — H&P (View-Only) (Signed)
 [facility] Gastroenterology Inpatient Progress Note    Subjective: Patient is a 68 y/o male with history of CAD on ASA and PLAVIX , Type 2 DM, Cocaine abuse, , hypertension and dyslipidemia who reported to the ED for hypotension. The primary team has held his ASA and Plavix  in preparation for endoluminal evaluation. Patient is being seen  today for follow up of anemia secondary to gastrointestinal bleeding maroon colored stools with drop in Hgb to 6.9 from 8.5, now back up to 9.4 s/p 2 uPRBC transfusion  Objective: Vital signs in last 24 hours: Temp:  [98.2 F (36.8 C)-99.1 F (37.3 C)] 98.3 F (36.8 C) (09/02 0833) Pulse Rate:  [55-72] 57 (09/02 0833) Resp:  [14-19] 14 (09/02 0833) BP: (102-132)/(63-83) 132/83 (09/02 0833) SpO2:  [100 %] 100 % (09/02 0833) Blood pressure 132/83, pulse (!) 57, temperature 98.3 F (36.8 C), resp. rate 14, height 5' 10 (1.778 m), weight 60.3 kg, SpO2 100%.    Intake/Output from previous day: 09/01 0701 - 09/02 0700 In: 420 [P.O.:420] Out: 1150 [Urine:1150]  Intake/Output this shift: Total I/O In: 810 [P.O.:810] Out: 550 [Urine:550]   Gen: NAD. Appears comfortable.  HEENT: [place]/AT. PERRLA. Normal external ear exam.  Chest: CTA, no wheezes.  CV: RR nl S1, S2. No gallops.  Abd: soft, nt, nd. BS+  Ext: no edema. Pulses 2+  Neuro: Alert and oriented. Judgement appears normal. Nonfocal.   Lab Results: Results for orders placed or performed during the hospital encounter of 12/28/23 (from the past 24 hours)  Hemoglobin     Status: Abnormal   Collection Time: 12/30/23  6:59 PM  Result Value Ref Range   Hemoglobin 10.1 (L) 13.0 - 17.0 g/dL  Hemoglobin     Status: Abnormal   Collection Time: 12/31/23  4:54 AM  Result Value Ref Range   Hemoglobin 9.4 (L) 13.0 - 17.0 g/dL     Recent Labs    91/69/74 2013 12/28/23 2055 12/29/23 0538 12/29/23 1051 12/29/23 1711 12/29/23 2240 12/30/23 0514 12/30/23 1859 12/31/23 0454  WBC 6.6   --  7.9  --   --   --  7.1  --   --   HGB 8.5*   < > 8.9*   < > 9.2* 7.2* 9.2* 10.1* 9.4*  HCT 23.5*   < > 24.3*   < > 25.4* 20.2* 25.6*  --   --   PLT 227  --  292  --   --   --  218  --   --    < > = values in this interval not displayed.   BMET Recent Labs    12/28/23 2013 12/29/23 0538 12/30/23 0514  NA 132* 134* 133*  K 3.8 3.5 3.7  CL 99 99 104  CO2 24 26 22   GLUCOSE 115* 99 89  BUN 40* 31* 19  CREATININE 0.64 0.62 0.66  CALCIUM  8.1* 8.2* 7.9*   LFT Recent Labs    12/30/23 0514  PROT 4.9*  ALBUMIN 2.4*  AST 50*  ALT 57*  ALKPHOS 37*  BILITOT 0.6   PT/INR Recent Labs    12/28/23 2225  LABPROT 15.1  INR 1.1   Hepatitis Panel No results for input(s): HEPBSAG, HCVAB, HEPAIGM, HEPBIGM in the last 72 hours. C-Diff No results for input(s): CDIFFTOX in the last 72 hours. No results for input(s): CDIFFPCR in the last 72 hours.   Studies/Results: No results found.  Scheduled Inpatient Medications:    atorvastatin   40 mg Oral Daily  brimonidine   1 drop Both Eyes BID   escitalopram   10 mg Oral Daily   lactulose   20 g Oral BID   latanoprost   1 drop Both Eyes QHS   pantoprazole  (PROTONIX ) IV  40 mg Intravenous Q12H   polyethylene glycol  17 g Oral BID    Continuous Inpatient Infusions:    sodium chloride       PRN Inpatient Medications:  acetaminophen , cyclobenzaprine , fluticasone , hydrocortisone  cream, nitroGLYCERIN , ondansetron  **OR** ondansetron  (ZOFRAN ) IV, traMADol , traZODone   Miscellaneous: N/A  Assessment:  Principal Problem:   GI bleeding Active Problems:   ABLA (acute blood loss anemia)   Essential hypertension   Dyslipidemia   Depression   Hypotension   Type 2 diabetes mellitus without complications (HCC)   Scott Yoder. is a 68 y.o. male with history of cocaine use, coronary artery disease, prior history of stroke, TIA on aspirin  and Plavix  is admitted with hematochezia, acute blood loss anemia . Off PLAVIX  day  4.     Plan:  Proceed with EGD and colonoscopy tomorrow. The patient understands the nature of the planned procedure, indications, risks, alternatives and potential complications including but not limited to bleeding, infection, perforation, damage to internal organs and possible oversedation/side effects from anesthesia. The patient agrees and gives consent to proceed.  Please refer to procedure notes for findings, recommendations and patient disposition/instructions.   Teyla Skidgel K. Aundria, M.D. 12/31/2023, 2:44 PM

## 2023-12-31 NOTE — Progress Notes (Addendum)
 [facility] Gastroenterology Inpatient Progress Note    Subjective: Patient is a 68 y/o male with history of CAD on ASA and PLAVIX , Type 2 DM, Cocaine abuse, , hypertension and dyslipidemia who reported to the ED for hypotension. The primary team has held his ASA and Plavix  in preparation for endoluminal evaluation. Patient is being seen  today for follow up of anemia secondary to gastrointestinal bleeding maroon colored stools with drop in Hgb to 6.9 from 8.5, now back up to 9.4 s/p 2 uPRBC transfusion  Objective: Vital signs in last 24 hours: Temp:  [98.2 F (36.8 C)-99.1 F (37.3 C)] 98.3 F (36.8 C) (09/02 0833) Pulse Rate:  [55-72] 57 (09/02 0833) Resp:  [14-19] 14 (09/02 0833) BP: (102-132)/(63-83) 132/83 (09/02 0833) SpO2:  [100 %] 100 % (09/02 0833) Blood pressure 132/83, pulse (!) 57, temperature 98.3 F (36.8 C), resp. rate 14, height 5' 10 (1.778 m), weight 60.3 kg, SpO2 100%.    Intake/Output from previous day: 09/01 0701 - 09/02 0700 In: 420 [P.O.:420] Out: 1150 [Urine:1150]  Intake/Output this shift: Total I/O In: 810 [P.O.:810] Out: 550 [Urine:550]   Gen: NAD. Appears comfortable.  HEENT: [place]/AT. PERRLA. Normal external ear exam.  Chest: CTA, no wheezes.  CV: RR nl S1, S2. No gallops.  Abd: soft, nt, nd. BS+  Ext: no edema. Pulses 2+  Neuro: Alert and oriented. Judgement appears normal. Nonfocal.   Lab Results: Results for orders placed or performed during the hospital encounter of 12/28/23 (from the past 24 hours)  Hemoglobin     Status: Abnormal   Collection Time: 12/30/23  6:59 PM  Result Value Ref Range   Hemoglobin 10.1 (L) 13.0 - 17.0 g/dL  Hemoglobin     Status: Abnormal   Collection Time: 12/31/23  4:54 AM  Result Value Ref Range   Hemoglobin 9.4 (L) 13.0 - 17.0 g/dL     Recent Labs    91/69/74 2013 12/28/23 2055 12/29/23 0538 12/29/23 1051 12/29/23 1711 12/29/23 2240 12/30/23 0514 12/30/23 1859 12/31/23 0454  WBC 6.6   --  7.9  --   --   --  7.1  --   --   HGB 8.5*   < > 8.9*   < > 9.2* 7.2* 9.2* 10.1* 9.4*  HCT 23.5*   < > 24.3*   < > 25.4* 20.2* 25.6*  --   --   PLT 227  --  292  --   --   --  218  --   --    < > = values in this interval not displayed.   BMET Recent Labs    12/28/23 2013 12/29/23 0538 12/30/23 0514  NA 132* 134* 133*  K 3.8 3.5 3.7  CL 99 99 104  CO2 24 26 22   GLUCOSE 115* 99 89  BUN 40* 31* 19  CREATININE 0.64 0.62 0.66  CALCIUM  8.1* 8.2* 7.9*   LFT Recent Labs    12/30/23 0514  PROT 4.9*  ALBUMIN 2.4*  AST 50*  ALT 57*  ALKPHOS 37*  BILITOT 0.6   PT/INR Recent Labs    12/28/23 2225  LABPROT 15.1  INR 1.1   Hepatitis Panel No results for input(s): HEPBSAG, HCVAB, HEPAIGM, HEPBIGM in the last 72 hours. C-Diff No results for input(s): CDIFFTOX in the last 72 hours. No results for input(s): CDIFFPCR in the last 72 hours.   Studies/Results: No results found.  Scheduled Inpatient Medications:    atorvastatin   40 mg Oral Daily  brimonidine   1 drop Both Eyes BID   escitalopram   10 mg Oral Daily   lactulose   20 g Oral BID   latanoprost   1 drop Both Eyes QHS   pantoprazole  (PROTONIX ) IV  40 mg Intravenous Q12H   polyethylene glycol  17 g Oral BID    Continuous Inpatient Infusions:    sodium chloride       PRN Inpatient Medications:  acetaminophen , cyclobenzaprine , fluticasone , hydrocortisone  cream, nitroGLYCERIN , ondansetron  **OR** ondansetron  (ZOFRAN ) IV, traMADol , traZODone   Miscellaneous: N/A  Assessment:  Principal Problem:   GI bleeding Active Problems:   ABLA (acute blood loss anemia)   Essential hypertension   Dyslipidemia   Depression   Hypotension   Type 2 diabetes mellitus without complications (HCC)   Scott Mattheus Rauls. is a 68 y.o. male with history of cocaine use, coronary artery disease, prior history of stroke, TIA on aspirin  and Plavix  is admitted with hematochezia, acute blood loss anemia . Off PLAVIX  day  4.     Plan:  Proceed with EGD and colonoscopy tomorrow. The patient understands the nature of the planned procedure, indications, risks, alternatives and potential complications including but not limited to bleeding, infection, perforation, damage to internal organs and possible oversedation/side effects from anesthesia. The patient agrees and gives consent to proceed.  Please refer to procedure notes for findings, recommendations and patient disposition/instructions.   Scott Yoder K. Aundria, M.D. 12/31/2023, 2:44 PM

## 2023-12-31 NOTE — Plan of Care (Signed)

## 2023-12-31 NOTE — TOC Initial Note (Signed)
 Transition of Care (TOC) - Initial/Assessment Note    Patient Details  Name: Scott Yoder. MRN: [id number] Date of Birth: 12-Feb-1956  Transition of Care [facility]) CM/SW Contact:    Corean ONEIDA Haddock, RN Phone Number: 12/31/2023, 3:25 PM  Clinical Narrative:                  Patient admitted from home with GI bleed TOC consult for medication assistance.  Per Chart review patient has medication coverage through [facility] so there would not be additional resources that IP Care Management can provide.  MD and pharmacy update  Patient active with [facility] for PT and OT       Patient Goals and CMS Choice            Expected Discharge Plan and Services                                              Prior Living Arrangements/Services                       Activities of Daily Living   ADL Screening (condition at time of admission) Independently performs ADLs?: Yes (appropriate for developmental age) Is the patient deaf or have difficulty hearing?: No Does the patient have difficulty seeing, even when wearing glasses/contacts?: No Does the patient have difficulty concentrating, remembering, or making decisions?: No  Permission Sought/Granted                  Emotional Assessment              Admission diagnosis:  Lower GI bleeding [K92.2] Gastrointestinal hemorrhage, unspecified gastrointestinal hemorrhage type [K92.2] Patient Active Problem List   Diagnosis Date Noted   GI bleeding 12/29/2023   ABLA (acute blood loss anemia) 12/29/2023   Essential hypertension 12/29/2023   Dyslipidemia 12/29/2023   Depression 12/29/2023   Hypotension 12/29/2023   Type 2 diabetes mellitus without complications (HCC) 12/29/2023   TIA (transient ischemic attack) 12/11/2023   Atherosclerosis of coronary artery 07/31/2014   Diabetes (HCC) 07/31/2014   BP (high blood pressure) 07/31/2014   Coronary artery disease    Hernia, inguinal 07/14/2014    PCP:  [facility] Pharmacy:   [facility] [address] ([address]), [place] - [address] [place]) [place] [address] Phone: [phone] Fax: [phone]     Social Drivers of Health (SDOH) Social History: SDOH Screenings   Food Insecurity: No Food Insecurity (12/29/2023)  Housing: Low Risk  (12/29/2023)  Transportation Needs: No Transportation Needs (12/29/2023)  Utilities: Not At Risk (12/29/2023)  Financial Resource Strain: Low Risk  (10/05/2022)   Received from [facility]  Social Connections: Unknown (12/29/2023)  Tobacco Use: Low Risk  (12/28/2023)   SDOH Interventions:     Readmission Risk Interventions     No data to display

## 2023-12-31 NOTE — Progress Notes (Signed)
 PROGRESS NOTE    Scott Yoder.  FMW:[id number] DOB: 1956-03-31 DOA: 12/28/2023 PCP: [facility]  218A/218A-AA  LOS: 3 days   Brief hospital course:   Assessment & Plan: Scott Mouhamed Glassco. is a 68 y.o. male with medical history significant for coronary artery disease on aspirin  and Plavix , type 2 diabetes mellitus, TIA, hypertension and dyslipidemia, who presented to the emergency room with acute onset of hypotension.  Patient was at home this evening when he began to feel dizzy and hot and then lost consciousness falling back into his chair.[name]  He was having foaming in the mouth at the time with no seizure activity witnessed.  It is unclear if he hit his head.  He denies any nausea or vomiting or abdominal pain.  No heartburn or diarrhea or melena or bright red bleeding per rectum.  He was hypotensive per EMS with a BP of 72/40 upon their arrival.  He was given 500 mL IV normal saline bolus with improvement.  He denied any chest pain or palpitations.  No cough or wheezing or hemoptysis.  No dysuria, oliguria or hematuria or flank pain.  The patient had maroon-colored stool with positive stool Hemoccult by the EDP.   GI bleeding:  etiology unclear. S/p 1 unit of pRBCs transfused (per transfusion hx).  Plavix  needs to be held x 5 days before any procedures unless pt continues to bleed or becomes unstable as per GI.  --hold asa and plavix  --EGD and colonoscopy tomorrow   Acute blood loss anemia:  likely secondary to above.  --Hgb 8.5 on presentation, dropped to 6.9 after IVF.  Hgb was 17 on 12/18/23. S/p 1 unit of pRBCs transfused (per transfusion hx). --monitor Hgb and transfuse to keep Hgb >7   Hypotension:  likely secondary to GI bleeding.  S/p 2L IVF in the ED, with BP wnl now. --hold home antihypertensives   HLD:  continue on statin    HTN --hold home antihypertensives   DM2:  well controlled, HbA1c 5.3. No need for SSI currently    Depression: severity  unknown.  Continue on home dose of lexapro    Transaminitis: mild.  Etiology unclear. Will continue to monitor   Hyponatremia --Na stable in low 130's --monitor    DVT prophylaxis: SCD/Compression stockings Code Status: Full code  Family Communication:  Level of care: Telemetry Medical Dispo:   The patient is from: home Anticipated d/c is to: home Anticipated d/c date is: 1-2 days   Subjective and Interval History:  Had dark bloody BM last night, but Hgb stable.  Pt complained of toothache today, which was present PTA.   Objective: Vitals:   12/30/23 1942 12/31/23 0524 12/31/23 0833 12/31/23 1610  BP: 115/76 122/73 132/83 (!) 150/89  Pulse: 70 (!) 55 (!) 57 64  Resp: 16 16 14 16   Temp: 98.4 F (36.9 C) 98.2 F (36.8 C) 98.3 F (36.8 C) 97.6 F (36.4 C)  TempSrc: Oral Oral  Oral  SpO2: 100% 100% 100% 100%  Weight:      Height:        Intake/Output Summary (Last 24 hours) at 12/31/2023 2037 Last data filed at 12/31/2023 1905 Gross per 24 hour  Intake 1590 ml  Output 1200 ml  Net 390 ml   Filed Weights   12/28/23 2009  Weight: 60.3 kg    Examination:   Constitutional: NAD, alert, oriented HEENT: conjunctivae and lids normal, EOMI CV: No cyanosis.   RESP: normal respiratory effort, on RA Neuro: II -  XII grossly intact.     Data Reviewed: I have personally reviewed labs and imaging studies  Time spent: 35 minutes  Ellouise Haber, MD [facility] If 7PM-7AM, please contact night-coverage 12/31/2023, 8:37 PM

## 2024-01-01 ENCOUNTER — Inpatient Hospital Stay

## 2024-01-01 ENCOUNTER — Encounter: Payer: Self-pay | Admitting: Family Medicine

## 2024-01-01 ENCOUNTER — Encounter
Admission: EM | Disposition: A | Discharge status: Home Health | Payer: Self-pay | Source: Home / Self Care | Attending: Hospitalist

## 2024-01-01 DIAGNOSIS — D62 Acute posthemorrhagic anemia: Secondary | ICD-10-CM | POA: Diagnosis not present

## 2024-01-01 DIAGNOSIS — K922 Gastrointestinal hemorrhage, unspecified: Secondary | ICD-10-CM | POA: Diagnosis not present

## 2024-01-01 DIAGNOSIS — I10 Essential (primary) hypertension: Secondary | ICD-10-CM | POA: Diagnosis not present

## 2024-01-01 HISTORY — PX: POLYPECTOMY: SHX149

## 2024-01-01 HISTORY — PX: COLONOSCOPY: SHX5424

## 2024-01-01 HISTORY — PX: ESOPHAGOGASTRODUODENOSCOPY: SHX5428

## 2024-01-01 LAB — HEMOGLOBIN: Hemoglobin: 9.9 g/dL — ABNORMAL LOW (ref 13.0–17.0)

## 2024-01-01 SURGERY — EGD (ESOPHAGOGASTRODUODENOSCOPY)
Anesthesia: General

## 2024-01-01 MED ORDER — AMLODIPINE BESYLATE 10 MG PO TABS
10.0000 mg | ORAL_TABLET | Freq: Every day | ORAL | Status: DC
  Administered 2024-01-01: 10 mg via ORAL
  Filled 2024-01-01: qty 1

## 2024-01-01 MED ORDER — VITAMIN B-12 1000 MCG PO TABS
1000.0000 ug | ORAL_TABLET | Freq: Every day | ORAL | Status: DC
  Administered 2024-01-01: 1000 ug via ORAL
  Filled 2024-01-01: qty 1

## 2024-01-01 MED ORDER — PROPOFOL 10 MG/ML IV BOLUS
INTRAVENOUS | Status: DC | PRN
  Administered 2024-01-01: 100 ug/kg/min via INTRAVENOUS
  Administered 2024-01-01: 100 mg via INTRAVENOUS

## 2024-01-01 MED ORDER — CYANOCOBALAMIN 1000 MCG PO TABS: 1000.0000 ug | ORAL_TABLET | Freq: Every day | ORAL | 0 refills | Status: AC

## 2024-01-01 MED ORDER — EPHEDRINE SULFATE-NACL 50-0.9 MG/10ML-% IV SOSY
PREFILLED_SYRINGE | INTRAVENOUS | Status: DC | PRN
  Administered 2024-01-01: 5 mg via INTRAVENOUS
  Administered 2024-01-01: 10 mg via INTRAVENOUS

## 2024-01-01 MED ORDER — PEG 3350-KCL-NA BICARB-NACL 420 G PO SOLR
4000.0000 mL | Freq: Once | ORAL | Status: AC
  Administered 2024-01-01: 4000 mL via ORAL
  Filled 2024-01-01: qty 4000

## 2024-01-01 MED ORDER — EPINEPHRINE 1 MG/10ML IJ SOSY
PREFILLED_SYRINGE | INTRAMUSCULAR | Status: AC
  Filled 2024-01-01: qty 20

## 2024-01-01 MED ORDER — POLYETHYLENE GLYCOL 3350 17 GM/SCOOP PO POWD
238.0000 g | Freq: Once | ORAL | Status: DC
  Filled 2024-01-01: qty 238

## 2024-01-01 MED ORDER — EPHEDRINE 5 MG/ML INJ
INTRAVENOUS | Status: AC
Start: 2024-01-01 — End: 2024-01-01
  Filled 2024-01-01: qty 5

## 2024-01-01 MED ORDER — SODIUM CHLORIDE 0.9 % IV SOLN: INTRAVENOUS | Status: DC

## 2024-01-01 MED ORDER — PANTOPRAZOLE SODIUM 40 MG PO TBEC: 40.0000 mg | DELAYED_RELEASE_TABLET | Freq: Two times a day (BID) | ORAL | 0 refills | Status: AC

## 2024-01-01 NOTE — Progress Notes (Addendum)
  Progress Note   Patient: Scott Yoder. FMW:[id number] DOB: 14-Oct-1955 DOA: 12/28/2023     4 DOS: the patient was seen and examined on 01/01/2024   Brief hospital course: Scott Yoder. is a 68 y.o. male with medical history significant for coronary artery disease on aspirin  and Plavix , type 2 diabetes mellitus, TIA, hypertension and dyslipidemia, who presented to the emergency room with acute onset of hypotension.  Patient began to feel dizzy and hot and then lost consciousness falling back into his chair.[name]  He was having foaming in the mouth at the time with no seizure activity witnessed.  He was hypotensive per EMS with a BP of 72/40 upon their arrival.  He was given 500 mL IV normal saline bolus with improvement.  The patient had maroon-colored stool. Patient treated with IV Protonix , seen by GI, scheduled for EGD and colonoscopy.   Principal Problem:   GI bleeding Active Problems:   ABLA (acute blood loss anemia)   Hypotension   Dyslipidemia   Essential hypertension   Depression   Type 2 diabetes mellitus without complications (HCC)   Assessment and Plan: GI bleeding:  Acute blood loss anemia:  Transient hypotension secondary to GI bleed Etiology of GI bleeding still not clear, on Protonix .  Seen by GI, scheduled for EGD and colonoscopy today.  Hemoglobin so far has been stable. Blood pressure also better,. Iron study was normal, B12 level was 347 with a goal of 400, will start oral B12 supplement.   HLD:  continue on statin    HTN Blood pressure slightly running higher today, restart amlodipine .  Gradually starting the rest of blood pressure medicine if blood pressure still running high.   DM2:  well controlled, HbA1c 5.3. No need for SSI currently    Depression: severity unknown.  Continue on home dose of lexapro    Transaminitis: mild.  Etiology unclear. Will continue to monitor    Hyponatremia Condition appears to be stable        Subjective:   Patient feels well today, some dark watery stools after bowel prep  Physical Exam: Vitals:   12/31/23 1610 12/31/23 2102 01/01/24 0455 01/01/24 0746  BP: (!) 150/89 (!) 140/85 128/83 138/84  Pulse: 64 65 63 (!) 57  Resp: 16 16 16 18   Temp: 97.6 F (36.4 C) 98.1 F (36.7 C) 97.9 F (36.6 C) 97.9 F (36.6 C)  TempSrc: Oral  Oral   SpO2: 100% 100% 100% 100%  Weight:      Height:       General exam: Appears calm and comfortable  Respiratory system: Clear to auscultation. Respiratory effort normal. Cardiovascular system: S1 & S2 heard, RRR. No JVD, murmurs, rubs, gallops or clicks. No pedal edema. Gastrointestinal system: Abdomen is nondistended, soft and nontender. No organomegaly or masses felt. Normal bowel sounds heard. Central nervous system: Alert and oriented. No focal neurological deficits. Extremities: Symmetric 5 x 5 power. Skin: No rashes, lesions or ulcers Psychiatry: Judgement and insight appear normal. Mood & affect appropriate.    Data Reviewed:  Reviewed lab results.  Family Communication: Daughter updated over the phone  Disposition: Status is: Inpatient Remains inpatient appropriate because: Severity of disease, inpatient procedure.     Time spent: 35 minutes  Author: Murvin Mana, MD 01/01/2024 11:11 AM  For on call review [email or web address].

## 2024-01-01 NOTE — Hospital Course (Addendum)
 Scott Yoder. is a 67 y.o. male with medical history significant for coronary artery disease on aspirin  and Plavix , type 2 diabetes mellitus, TIA, hypertension and dyslipidemia, who presented to the emergency room with acute onset of hypotension.  Patient began to feel dizzy and hot and then lost consciousness falling back into his chair.[name]  He was having foaming in the mouth at the time with no seizure activity witnessed.  He was hypotensive per EMS with a BP of 72/40 upon their arrival.  He was given 500 mL IV normal saline bolus with improvement.  The patient had maroon-colored stool. Patient treated with IV Protonix , seen by GI, scheduled for EGD and colonoscopy.

## 2024-01-01 NOTE — Op Note (Signed)
 [facility]enterology Patient Name: Scott Yoder Procedure Date: 01/01/2024 2:19 PM MRN: [id number] Account #: [id number] Date of Birth: 02/07/1956 Admit Type: Inpatient Age: 68 Room: [facility] ENDO ROOM 2 Gender: Male Note Status: Finalized Instrument Name: Upper GI Scope [id number] Procedure:             Upper GI endoscopy Indications:           Acute post hemorrhagic anemia, Recent gastrointestinal                         bleeding Providers:             Farrel Guimond K. Keyontay Stolz MD, MD Medicines:             Propofol  per Anesthesia Complications:         No immediate complications. Estimated blood loss: None. Procedure:             Pre-Anesthesia Assessment:                        - The risks and benefits of the procedure and the                         sedation options and risks were discussed with the                         patient. All questions were answered and informed                         consent was obtained.                        - Patient identification and proposed procedure were                         verified prior to the procedure by the nurse. The                         procedure was verified in the procedure room.                        - ASA Grade Assessment: III - A patient with severe                         systemic disease.                        - After reviewing the risks and benefits, the patient                         was deemed in satisfactory condition to undergo the                         procedure.                        After obtaining informed consent, the endoscope was                         passed under direct vision. Throughout the procedure,  the patient's blood pressure, pulse, and oxygen                         saturations were monitored continuously. The Endoscope                         was introduced through the mouth, and advanced to the                         third part of duodenum. The  upper GI endoscopy was                         accomplished without difficulty. The patient tolerated                         the procedure well. Findings:      The esophagus was normal.      Patchy mild inflammation characterized by erosions was found in the       gastric antrum. Biopsies were taken with a cold forceps for Helicobacter       pylori testing. Estimated blood loss was minimal.      The exam of the stomach was otherwise normal.      Two non-bleeding cratered duodenal ulcers with no stigmata of bleeding       were found in the duodenal bulb. The largest lesion was 23 mm in largest       dimension.      The second portion of the duodenum and third portion of the duodenum       were normal. Estimated blood loss: none. Impression:            - Normal esophagus.                        - Gastritis. Biopsied.                        - Non-bleeding duodenal ulcers with no stigmata of                         bleeding.                        - Normal second portion of the duodenum and third                         portion of the duodenum. Recommendation:        - Await pathology results.                        - Use Protonix  (pantoprazole ) 40 mg PO BID.                        - Proceed with colonoscopy Procedure Code(s):     --- Professional ---                        [phone], Esophagogastroduodenoscopy, flexible,                         transoral; with biopsy, single or multiple Diagnosis Code(s):     --- Professional ---  K92.2, Gastrointestinal hemorrhage, unspecified                        D62, Acute posthemorrhagic anemia                        K26.9, Duodenal ulcer, unspecified as acute or                         chronic, without hemorrhage or perforation                        K29.70, Gastritis, unspecified, without bleeding CPT copyright 2022 American Medical Association. All rights reserved. The codes documented in this report are preliminary and upon  coder review may  be revised to meet current compliance requirements. Ladell MARLA Boss MD, MD 01/01/2024 2:38:59 PM This report has been signed electronically. Number of Addenda: 0 Note Initiated On: 01/01/2024 2:19 PM Estimated Blood Loss:  Estimated blood loss: none.      [facility]

## 2024-01-01 NOTE — Transfer of Care (Signed)
 Immediate Anesthesia Transfer of Care Note  Patient: Scott Maiello Jr.  Procedure(s) Performed: EGD (ESOPHAGOGASTRODUODENOSCOPY) COLONOSCOPY POLYPECTOMY, INTESTINE  Patient Location: Endoscopy Unit  Anesthesia Type:General  Level of Consciousness: drowsy  Airway & Oxygen Therapy: Patient Spontanous Breathing  Post-op Assessment: Report given to RN and Post -op Vital signs reviewed and stable  Post vital signs: Reviewed and stable  Last Vitals:  Vitals Value Taken Time  BP 98/67   Temp 35.6 C 01/01/24 15:05  Pulse    Resp 12   SpO2 100     Last Pain:  Vitals:   01/01/24 1505  TempSrc: Rectal  PainSc: 0-No pain      Patients Stated Pain Goal: 0 (12/30/23 2158)  Complications: No notable events documented.

## 2024-01-01 NOTE — Op Note (Signed)
 [facility]astro[facility] Patient Name: Scott Yoder Procedure Date: 01/01/2024 2:18 PM MRN: [id number] Account #: [id number] Date of Birth: Mar 18, 1956 Admit Type: Inpatient Age: 68 Room: [facility] ENDO ROOM 2 Gender: Male Note Status: Finalized Instrument Name: Colon Scope [id number] Procedure:             Colonoscopy Indications:           Melena, Acute post hemorrhagic anemia Providers:             Farheen Pfahler K. Enma Maeda MD, MD Medicines:             Propofol  per Anesthesia Complications:         No immediate complications. Estimated blood loss: None. Procedure:             Pre-Anesthesia Assessment:                        - The risks and benefits of the procedure and the                         sedation options and risks were discussed with the                         patient. All questions were answered and informed                         consent was obtained.                        - Patient identification and proposed procedure were                         verified prior to the procedure by the nurse. The                         procedure was verified in the procedure room.                        - ASA Grade Assessment: III - A patient with severe                         systemic disease.                        - After reviewing the risks and benefits, the patient                         was deemed in satisfactory condition to undergo the                         procedure.                        After obtaining informed consent, the colonoscope was                         passed under direct vision. Throughout the procedure,                         the patient's blood pressure, pulse, and oxygen  saturations were monitored continuously. The                         Colonoscope was introduced through the anus and                         advanced to the the cecum, identified by appendiceal                         orifice and ileocecal valve.  The colonoscopy was                         somewhat difficult due to a redundant colon.                         Successful completion of the procedure was aided by                         withdrawing and reinserting the scope. The patient                         tolerated the procedure well. The quality of the bowel                         preparation was adequate. The ileocecal valve,                         appendiceal orifice, and rectum were photographed. Findings:      The perianal and digital rectal examinations were normal. Pertinent       negatives include normal sphincter tone and no palpable rectal lesions.      A 9 mm polyp was found in the transverse colon. The polyp was sessile.       The polyp was removed with a cold snare. Resection and retrieval were       complete. Estimated blood loss was minimal.      A 16 mm polyp was found in the transverse colon. The polyp was       semi-pedunculated. The polyp was removed with a hot snare. Resection and       retrieval were complete. Estimated blood loss: none.      The exam was otherwise without abnormality on direct and retroflexion       views. Impression:            - One 9 mm polyp in the transverse colon, removed with                         a cold snare. Resected and retrieved.                        - One 16 mm polyp in the transverse colon, removed                         with a hot snare. Resected and retrieved.                        - The examination was otherwise normal on direct and  retroflexion views. Recommendation:        - Await pathology results from EGD, also performed                         today.                        - Return patient to hospital ward for possible                         discharge same day.                        - Advance diet as tolerated.                        - Return to my office in 3 weeks.                        - The findings and recommendations were  discussed with                         the patient. Procedure Code(s):     --- Professional ---                        [phone], Colonoscopy, flexible; with removal of                         tumor(s), polyp(s), or other lesion(s) by snare                         technique Diagnosis Code(s):     --- Professional ---                        D62, Acute posthemorrhagic anemia                        K92.1, Melena (includes Hematochezia)                        D12.3, Benign neoplasm of transverse colon (hepatic                         flexure or splenic flexure) CPT copyright 2022 American Medical Association. All rights reserved. The codes documented in this report are preliminary and upon coder review may  be revised to meet current compliance requirements. Ladell MARLA Boss MD, MD 01/01/2024 3:07:26 PM This report has been signed electronically. Number of Addenda: 0 Note Initiated On: 01/01/2024 2:18 PM Scope Withdrawal Time: 0 hours 9 minutes 59 seconds  Total Procedure Duration: 0 hours 21 minutes 26 seconds  Estimated Blood Loss:  Estimated blood loss: none.      [facility]

## 2024-01-01 NOTE — Anesthesia Preprocedure Evaluation (Signed)
 Anesthesia Evaluation  Patient identified by MRN, date of birth, ID band Patient awake    Reviewed: Allergy & Precautions, NPO status , Patient's Chart, lab work & pertinent test results  History of Anesthesia Complications Negative for: history of anesthetic complications  Airway Mallampati: III  TM Distance: >3 FB Neck ROM: full    Dental  (+) Chipped, Poor Dentition   Pulmonary neg pulmonary ROS, neg shortness of breath   Pulmonary exam normal        Cardiovascular Exercise Tolerance: Good hypertension, (-) angina + CAD  Normal cardiovascular exam     Neuro/Psych TIA negative psych ROS   GI/Hepatic negative GI ROS, Neg liver ROS,neg GERD  ,,  Endo/Other  diabetes    Renal/GU negative Renal ROS  negative genitourinary   Musculoskeletal   Abdominal   Peds  Hematology negative hematology ROS (+)   Anesthesia Other Findings Past Medical History: No date: Coronary artery disease 12/29/2023: Dyslipidemia No date: Hernia, inguinal No date: High cholesterol No date: Hypertension No date: Hypokalemia  Past Surgical History: No date: CORONARY ARTERY BYPASS GRAFT No date: TRACHEOSTOMY  BMI    Body Mass Index: 19.08 kg/m      Reproductive/Obstetrics negative OB ROS                              Anesthesia Physical Anesthesia Plan  ASA: 3  Anesthesia Plan: General   Post-op Pain Management:    Induction: Intravenous  PONV Risk Score and Plan: Propofol  infusion and TIVA  Airway Management Planned: Natural Airway and Nasal Cannula  Additional Equipment:   Intra-op Plan:   Post-operative Plan:   Informed Consent: I have reviewed the patients History and Physical, chart, labs and discussed the procedure including the risks, benefits and alternatives for the proposed anesthesia with the patient or authorized representative who has indicated his/her understanding and  acceptance.     Dental Advisory Given  Plan Discussed with: Anesthesiologist, CRNA and Surgeon  Anesthesia Plan Comments: (Patient consented for risks of anesthesia including but not limited to:  - adverse reactions to medications - risk of airway placement if required - damage to eyes, teeth, lips or other oral mucosa - nerve damage due to positioning  - sore throat or hoarseness - Damage to heart, brain, nerves, lungs, other parts of body or loss of life  Patient voiced understanding and assent.)        Anesthesia Quick Evaluation

## 2024-01-01 NOTE — TOC Transition Note (Signed)
 Transition of Care [facility]) - Discharge Note   Patient Details  Name: Scott Yoder. MRN: [id number] Date of Birth: [birth year]  Transition of Care [facility]) CM/SW Contact:  Lauraine JAYSON Carpen, LCSW Phone Number: 01/01/2024, 4:52 PM   Clinical Narrative:  Patient has orders to discharge home today. [facility] liaison is aware. No further concerns. CSW signing off.   Final next level of care: Home w Home Health Services Barriers to Discharge: Barriers Resolved   Patient Goals and CMS Choice            Discharge Placement                    Patient and family notified of of transfer: 01/01/24  Discharge Plan and Services Additional resources added to the After Visit Summary for                            [facility] Arranged: PT, OT HH Agency: [facility] Date HH Agency Contacted: 01/01/24   Representative spoke with at [facility] Agency: Shaun  Social Drivers of Health (SDOH) Interventions SDOH Screenings   Food Insecurity: No Food Insecurity (12/29/2023)  Housing: Low Risk  (12/29/2023)  Transportation Needs: No Transportation Needs (12/29/2023)  Utilities: Not At Risk (12/29/2023)  Financial Resource Strain: Low Risk  (10/05/2022)   Received from [facility]  Social Connections: Unknown (12/29/2023)  Tobacco Use: Low Risk  (12/28/2023)     Readmission Risk Interventions     No data to display

## 2024-01-01 NOTE — Interval H&P Note (Signed)
 History and Physical Interval Note:  01/01/2024 1:39 PM  Scott Yoder.  has presented today for surgery, with the diagnosis of Melena, GI bleed, anemia.  The various methods of treatment have been discussed with the patient and family. After consideration of risks, benefits and other options for treatment, the patient has consented to  Procedure(s): EGD (ESOPHAGOGASTRODUODENOSCOPY) (N/A) COLONOSCOPY (N/A) as a surgical intervention.  The patient's history has been reviewed, patient examined, no change in status, stable for surgery.  I have reviewed the patient's chart and labs.  Questions were answered to the patient's satisfaction.     [name], [name]

## 2024-01-01 NOTE — Discharge Summary (Signed)
 Physician Discharge Summary   Patient: Scott Yoder. MRN: [id number] DOB: [birth year]  Admit date:     12/28/2023  Discharge date: 01/01/24  Discharge Physician: Murvin Mana   PCP: [facility]   Recommendations at discharge:   Follow-up with PCP in 1 week. Follow-up with GI in 1 month Check a CBC at the next office visit. Consider restart Plavix  at next office visit. Recheck a B12 level in a month.  Discharge Diagnoses: Principal Problem:   GI bleeding Active Problems:   ABLA (acute blood loss anemia)   Hypotension   Dyslipidemia   Essential hypertension   Depression   Type 2 diabetes mellitus without complications (HCC)  Resolved Problems:   * No resolved hospital problems. *  Hospital Course: Scott Yoder. is a 68 y.o. male with medical history significant for coronary artery disease on aspirin  and Plavix , type 2 diabetes mellitus, TIA, hypertension and dyslipidemia, who presented to the emergency room with acute onset of hypotension.  Patient began to feel dizzy and hot and then lost consciousness falling back into his chair.[name]  He was having foaming in the mouth at the time with no seizure activity witnessed.  He was hypotensive per EMS with a BP of 72/40 upon their arrival.  He was given 500 mL IV normal saline bolus with improvement.  The patient had maroon-colored stool. Patient treated with IV Protonix , seen by GI, scheduled for EGD and colonoscopy. Results showed duodenal ulcer presumed to be the source of bleeding.  But currently bleeding.  Discussed with Dr. Aundria, patient is medically stable for discharge.  Continue Protonix  40 mg twice a day for 3 months.  Follow-up with GI in the near future. Assessment and Plan: Acute upper GI bleeding secondary to duodenal ulcer. Acute blood loss anemia:  Transient hypotension secondary to GI bleed Etiology of GI bleeding appears to be secondary to bleeding ulcer from duodenal.  Hemoglobin so far has been  stable. Blood pressure also better,. Iron study was normal, B12 level was 347 with a goal of 400, will start oral B12 supplement. EGD was performed today, showed duodenal ulcer with gastritis.  Colonoscopy was also performed, had polypectomy.  Follow-up with GI for pathology results.   HLD:  continue on statin    HTN Restarted Coreg  and amlodipine .  Continue hold lisinopril  for now.  May restart if blood pressure running high again in the office.   DM2:  well controlled, HbA1c 5.3.    Depression: severity unknown.  Continue on home dose of lexapro    Transaminitis: mild.  Etiology unclear.    Hyponatremia Condition appears to be stable         Consultants: GI Procedures performed: EGD and colonoscopy Disposition: Home Diet recommendation:  Discharge Diet Orders (From admission, onward)     Start     Ordered   01/01/24 0000  Diet - low sodium heart healthy        01/01/24 1642           Cardiac diet DISCHARGE MEDICATION: Allergies as of 01/01/2024   No Known Allergies      Medication List     STOP taking these medications    clopidogrel  75 MG tablet Commonly known as: PLAVIX    lisinopril  20 MG tablet Commonly known as: ZESTRIL        TAKE these medications    amLODipine  10 MG tablet Commonly known as: NORVASC  Take 1 tablet (10 mg total) by mouth daily.   aspirin  81 MG  tablet Take 81 mg by mouth daily.   atorvastatin  40 MG tablet Commonly known as: LIPITOR Take 1 tablet (40 mg total) by mouth daily.   brimonidine  0.15 % ophthalmic solution Commonly known as: ALPHAGAN  Place 1 drop into both eyes 3 (three) times daily.   carvedilol  25 MG tablet Commonly known as: COREG  Take 2 tablets by mouth 2 (two) times daily.   cyanocobalamin  1000 MCG tablet Take 1 tablet (1,000 mcg total) by mouth daily. Start taking on: January 02, 2024   cyclobenzaprine  10 MG tablet Commonly known as: FLEXERIL  Take 10 mg by mouth 3 (three) times daily as  needed.   escitalopram  10 MG tablet Commonly known as: LEXAPRO  Take 1 tablet (10 mg total) by mouth daily.   fluticasone  50 MCG/ACT nasal spray Commonly known as: FLONASE  Place 2 sprays into both nostrils daily.   hydrocortisone  2.5 % ointment Apply topically 2 (two) times daily.   lactulose  10 GM/15ML solution Commonly known as: CHRONULAC  Take 45 mLs (30 g total) by mouth 3 (three) times daily.   latanoprost  0.005 % ophthalmic solution Commonly known as: XALATAN  Place 1 drop into both eyes at bedtime.   nitroGLYCERIN  0.4 MG SL tablet Commonly known as: NITROSTAT  Place 0.4 mg under the tongue 3 (three) times daily as needed. 1 tab every five minutes x 3, as needed x chest pain   oxyCODONE  5 MG immediate release tablet Commonly known as: Oxy IR/ROXICODONE  Take 1-2 tablets (5-10 mg total) by mouth every 6 (six) hours as needed for severe pain (pain score 7-10).   pantoprazole  40 MG tablet Commonly known as: Protonix  Take 1 tablet (40 mg total) by mouth 2 (two) times daily before a meal.   senna-docusate 8.6-50 MG tablet Commonly known as: Senokot-S Take 2 tablets by mouth at bedtime as needed for mild constipation.        Follow-up Information     [facility] Follow up in 1 week(s).   Contact information: [address] [place] [address] [phone]         [place], Teodoro K, MD Follow up in 1 week(s).   Specialty: Gastroenterology Contact information: [address] [place] [address] [phone]                Discharge Exam: Scott Yoder   12/28/23 2009  Weight: 60.3 kg   General exam: Appears calm and comfortable  Respiratory system: Clear to auscultation. Respiratory effort normal. Cardiovascular system: S1 & S2 heard, RRR. No JVD, murmurs, rubs, gallops or clicks. No pedal edema. Gastrointestinal system: Abdomen is nondistended, soft and nontender. No organomegaly or masses felt. Normal bowel sounds  heard. Central nervous system: Alert and oriented. No focal neurological deficits. Extremities: Symmetric 5 x 5 power. Skin: No rashes, lesions or ulcers Psychiatry: Judgement and insight appear normal. Mood & affect appropriate.    Condition at discharge: good  The results of significant diagnostics from this hospitalization (including imaging, microbiology, ancillary and laboratory) are listed below for reference.   Imaging Studies: CT Angio Abd/Pel W and/or Wo Contrast Result Date: 12/28/2023 CLINICAL DATA:  Lower GI bleeding EXAM: CTA ABDOMEN AND PELVIS WITHOUT AND WITH CONTRAST TECHNIQUE: Multidetector CT imaging of the abdomen and pelvis was performed using the standard protocol during bolus administration of intravenous contrast. Multiplanar reconstructed images and MIPs were obtained and reviewed to evaluate the vascular anatomy. RADIATION DOSE REDUCTION: This exam was performed according to the departmental dose-optimization program which includes automated exposure control, adjustment of  the mA and/or kV according to patient size and/or use of iterative reconstruction technique. CONTRAST:  75mL OMNIPAQUE  IOHEXOL  350 MG/ML SOLN COMPARISON:  None Available. FINDINGS: VASCULAR Aorta: Normal caliber aorta without aneurysm, dissection, vasculitis or significant stenosis. Atherosclerotic calcifications are present. Celiac: Patent without evidence of aneurysm, dissection, vasculitis or significant stenosis. SMA: Patent without evidence of aneurysm, dissection, vasculitis or significant stenosis. Common origin with celiac axis. Renals: Both renal arteries are patent without evidence of aneurysm, dissection, vasculitis, fibromuscular dysplasia or significant stenosis. IMA: Patent without evidence of aneurysm, dissection, vasculitis or significant stenosis. Inflow: Severe atherosclerotic disease present. There is 50% focal stenosis in the proximal left common iliac artery secondary to calcified  atherosclerotic disease. There is also 50% stenosis in the mid left external iliac artery secondary to calcified and noncalcified atherosclerotic disease. Right-sided iliac arteries appear patent without significant stenosis or aneurysm. No dissection. There is also stenosis of the origin of the left internal iliac artery secondary to calcified and noncalcified atherosclerotic disease. Proximal Outflow: Bilateral common femoral and visualized portions of the superficial and profunda femoral arteries are patent without evidence of aneurysm, dissection, vasculitis or significant stenosis. Calcified atherosclerotic disease present. Veins: No obvious venous abnormality within the limitations of this arterial phase study. Review of the MIP images confirms the above findings. NON-VASCULAR Lower chest: No acute abnormality. Hepatobiliary: No focal liver abnormality is seen. No gallstones, gallbladder wall thickening, or biliary dilatation. Pancreas: Unremarkable. No pancreatic ductal dilatation or surrounding inflammatory changes. Spleen: Normal in size without focal abnormality. Adrenals/Urinary Tract: Adrenal glands are unremarkable. Kidneys are normal, without renal calculi, focal lesion, or hydronephrosis. Bladder is unremarkable. Stomach/Bowel: Stomach is within normal limits. Appendix is not seen. No evidence of bowel wall thickening, distention, or inflammatory changes. Lymphatic: Prostate gland is enlarged. Reproductive: Uterus and bilateral adnexa are unremarkable. Other: Degenerative changes affect the spine. Musculoskeletal: No fracture is seen. IMPRESSION: 1. No evidence for active GI bleeding. 2. Severe atherosclerotic disease of the aorta and its branches. 3. 50% stenosis in the left common iliac artery and left external iliac artery. 4. No acute localizing process in the abdomen or pelvis. 5. Prostatomegaly. Electronically Signed   By: Greig Pique M.D.   On: 12/28/2023 23:13   CT Head Wo Contrast Result  Date: 12/28/2023 CLINICAL DATA:  Seizure, new-onset, no history of trauma EXAM: CT HEAD WITHOUT CONTRAST TECHNIQUE: Contiguous axial images were obtained from the base of the skull through the vertex without intravenous contrast. RADIATION DOSE REDUCTION: This exam was performed according to the departmental dose-optimization program which includes automated exposure control, adjustment of the mA and/or kV according to patient size and/or use of iterative reconstruction technique. COMPARISON:  MRI head 12/11/2023 FINDINGS: Brain: Right temporal and left cerebellar lobe encephalomalacia. Patchy and confluent areas of decreased attenuation are noted throughout the deep and periventricular white matter of the cerebral hemispheres bilaterally, compatible with chronic microvascular ischemic disease. No evidence of large-territorial acute infarction. No parenchymal hemorrhage. No mass lesion. No extra-axial collection. No mass effect or midline shift. No hydrocephalus. Basilar cisterns are patent. Vascular: No hyperdense vessel. Atherosclerotic calcifications are present within the cavernous internal carotid and vertebral arteries. Skull: No acute fracture or focal lesion. Sinuses/Orbits: Paranasal sinuses and mastoid air cells are clear. The orbits are unremarkable. Other: None. IMPRESSION: No acute intracranial abnormality. Electronically Signed   By: Morgane  Naveau M.D.   On: 12/28/2023 20:30   ECHOCARDIOGRAM COMPLETE Result Date: 12/13/2023    ECHOCARDIOGRAM REPORT   Patient Name:  Scott Sidell Jr. Date of Exam: 12/13/2023 Medical Rec #:  [id number]             Height:       70.0 in Accession #:    [id number]            Weight:       123.2 lb Date of Birth:  1955/12/15             BSA:          1.699 m Patient Age:    68 years              BP:           145/90 mmHg Patient Gender: M                     HR:           53 bpm. Exam Location:  [facility] Procedure: 2D Echo, Cardiac Doppler and Color Doppler (Both  Spectral and Color            Flow Doppler were utilized during procedure). Indications:     TIA G45.9  History:         Patient has no prior history of Echocardiogram examinations.                  CAD, TIA; Risk Factors:Diabetes and Hypertension.  Sonographer:     Thea Norlander RCS Referring Phys:  [id number] SARA-MAIZ A THOMAS Diagnosing Phys: Marsa Dooms MD IMPRESSIONS  1. Left ventricular ejection fraction, by estimation, is 55 to 60%. The left ventricle has normal function. The left ventricle has no regional wall motion abnormalities. Left ventricular diastolic parameters were normal.  2. Right ventricular systolic function is normal. The right ventricular size is normal.  3. The mitral valve is normal in structure. Trivial mitral valve regurgitation. No evidence of mitral stenosis.  4. The aortic valve is normal in structure. Aortic valve regurgitation is not visualized. No aortic stenosis is present.  5. The inferior vena cava is normal in size with greater than 50% respiratory variability, suggesting right atrial pressure of 3 mmHg. FINDINGS  Left Ventricle: Left ventricular ejection fraction, by estimation, is 55 to 60%. The left ventricle has normal function. The left ventricle has no regional wall motion abnormalities. Strain was performed and the global longitudinal strain is indeterminate. The left ventricular internal cavity size was normal in size. There is no left ventricular hypertrophy. Left ventricular diastolic parameters were normal. Right Ventricle: The right ventricular size is normal. No increase in right ventricular wall thickness. Right ventricular systolic function is normal. Left Atrium: Left atrial size was normal in size. Right Atrium: Right atrial size was normal in size. Pericardium: There is no evidence of pericardial effusion. Mitral Valve: The mitral valve is normal in structure. Trivial mitral valve regurgitation. No evidence of mitral valve stenosis. Tricuspid Valve: The  tricuspid valve is normal in structure. Tricuspid valve regurgitation is trivial. No evidence of tricuspid stenosis. Aortic Valve: The aortic valve is normal in structure. Aortic valve regurgitation is not visualized. No aortic stenosis is present. Aortic valve peak gradient measures 3.9 mmHg. Pulmonic Valve: The pulmonic valve was normal in structure. Pulmonic valve regurgitation is not visualized. No evidence of pulmonic stenosis. Aorta: The aortic root is normal in size and structure. Venous: The inferior vena cava is normal in size with greater than 50% respiratory variability, suggesting right atrial pressure of 3 mmHg. IAS/Shunts: No atrial level shunt  detected by color flow Doppler. Additional Comments: 3D was performed not requiring image post processing on an independent workstation and was indeterminate.  LEFT VENTRICLE PLAX 2D LVIDd:         4.30 cm   Diastology LVIDs:         3.00 cm   LV e' medial:    7.29 cm/s LV PW:         1.20 cm   LV E/e' medial:  9.1 LV IVS:        1.10 cm   LV e' lateral:   10.30 cm/s LVOT diam:     2.30 cm   LV E/e' lateral: 6.4 LV SV:         58 LV SV Index:   34 LVOT Area:     4.15 cm  RIGHT VENTRICLE            IVC RV S prime:     9.57 cm/s  IVC diam: 1.50 cm TAPSE (M-mode): 1.5 cm LEFT ATRIUM             Index        RIGHT ATRIUM           Index LA diam:        2.40 cm 1.41 cm/m   RA Area:     12.00 cm LA Vol (A2C):   42.8 ml 25.16 ml/m  RA Volume:   27.30 ml  16.07 ml/m LA Vol (A4C):   37.0 ml 21.78 ml/m LA Biplane Vol: 39.3 ml 23.13 ml/m  AORTIC VALVE AV Area (Vmax): 3.07 cm AV Vmax:        98.50 cm/s AV Peak Grad:   3.9 mmHg LVOT Vmax:      72.80 cm/s LVOT Vmean:     40.400 cm/s LVOT VTI:       0.140 m  AORTA Ao Root diam: 3.80 cm Ao Asc diam:  3.70 cm MITRAL VALVE MV Area (PHT): 3.27 cm    SHUNTS MV Decel Time: 232 msec    Systemic VTI:  0.14 m MV E velocity: 66.00 cm/s  Systemic Diam: 2.30 cm MV A velocity: 61.30 cm/s MV E/A ratio:  1.08 Marsa Dooms MD  Electronically signed by Marsa Dooms MD Signature Date/Time: 12/13/2023/1:29:47 PM    Final    US  Abdomen Limited RUQ (LIVER/GB) Result Date: 12/11/2023 CLINICAL DATA:  221910 Elevated LFTs 221910 EXAM: ULTRASOUND ABDOMEN LIMITED RIGHT UPPER QUADRANT COMPARISON:  July 04, 2022 FINDINGS: Gallbladder: No gallstones. No wall thickening or pericholecystic fluid. No sonographic Murphy's sign noted by sonographer. Nonshadowing echogenicity along the nondependent gallbladder wall measuring 6 x 4 x 5 mm. A second small nonshadowing echogenicity measures 4 mm. Common bile duct: Diameter: 7 mm, likely within normal limits for patient's age. Liver: Normal echogenicity. No focal lesion identified. No intrahepatic biliary ductal dilation. Portal vein is patent on color Doppler imaging with normal direction of blood flow towards the liver. Right Kidney: Partially visualized. No mass. No hydronephrosis or nephrolithiasis. Other: None. IMPRESSION: 1. No cholecystolithiasis or changes of acute cholecystitis. 2. A couple of rounded echogenicities along the gallbladder wall measuring up to 6 mm, likely gallbladder polyps. Follow-up is recommended, as documented below. Polyp measuring 0.6 to 0.9 cm: -No risk factors for gallbladder malignancy: Follow-up ultrasound at 6 months, and then annually for 2 years. Any increase in size > 0.2 cm, consider cholecystectomy. Follow-up should be discontinued after 2 years, in the absence of growth. -Risk factors for gallbladder malignancy: Consider  cholecystectomy. If cholecystectomy is not deemed appropriate, then sonographic follow-up as above. RECOMMENDATIONS:As per the 2021 joint European Society guidelines, suggested management of gallbladder polyp(s) are as follows: *NB: Risk factors for gallbladder malignancy include: > 80 years old, history of underlying primary sclerosing cholangitis, Asian ethnicity, sessile polyp (including focal wall thickening > 0.4 cm). Electronically Signed    By: Rogelia Myers M.D.   On: 12/11/2023 22:22   MR BRAIN WO CONTRAST Result Date: 12/11/2023 CLINICAL DATA:  Initial evaluation for acute TIA. EXAM: MRI HEAD WITHOUT CONTRAST TECHNIQUE: Multiplanar, multiecho pulse sequences of the brain and surrounding structures were obtained without intravenous contrast. COMPARISON:  Prior CTs from earlier the same day. FINDINGS: Brain: Cerebral volume within normal limits. Scattered patchy T2/FLAIR hyperintensity involving the periventricular and deep white matter both cerebral hemispheres, consistent with chronic small vessel ischemic disease, moderately advanced in nature. Few scatter remote lacunar infarcts present about the hemispheric cerebral white matter and deep gray nuclei. Encephalomalacia and gliosis involving the anterior right temporal lobe noted. Small remote left cerebellar infarct present. Restricted diffusion involving the para falcine right frontal and parietal lobes with involvement of the splenium, consistent with an acute right ACA distribution infarct. No associated hemorrhage or mass effect. No other evidence for acute or subacute ischemia. No acute intracranial hemorrhage. Few scattered chronic micro hemorrhages noted about the deep gray nuclei, most characteristic of chronic poorly controlled hypertension. No mass lesion, midline shift or mass effect. No hydrocephalus or extra-axial fluid collection. Pituitary gland within normal limits. Chronic Vascular: Major intracranial vascular flow voids are maintained. Skull and upper cervical spine: Craniocervical junction within normal limits. Bone marrow signal intensity normal. No scalp soft tissue abnormality. Sinuses/Orbits: Globes orbital soft tissues within normal limits. Paranasal sinuses are largely clear. No mastoid effusion. Other: None. IMPRESSION: 1. Acute right ACA distribution infarct. No associated hemorrhage or mass effect. 2. Underlying moderately advanced chronic microvascular ischemic  disease with a few scattered remote lacunar infarcts involving the hemispheric cerebral white matter, deep gray nuclei, and left cerebellum. 3. Encephalomalacia and gliosis involving the anterior right temporal lobe, which could be related to prior trauma and/or ischemia. Electronically Signed   By: Morene Hoard M.D.   On: 12/11/2023 21:46   CT ANGIO HEAD NECK W WO CM Result Date: 12/11/2023 EXAM: CTA Head and Neck with Intravenous Contrast. CT Head without Contrast. CLINICAL HISTORY: 68 year old male with diabetes presenting with left leg weakness and history of transient ischemic attack (TIA). TECHNIQUE: Axial CTA images of the head and neck performed with intravenous contrast. MIP reconstructed images were created and reviewed. Axial computed tomography images of the head/brain performed without intravenous contrast. Note: Per PQRS, the description of internal carotid artery percent stenosis, including 0 percent or normal exam, is based on [place] American Symptomatic Carotid Endarterectomy Trial (NASCET) criteria. Dose reduction technique was used including one or more of the following: automated exposure control, adjustment of mA and kV according to patient size, and/or iterative reconstruction. CONTRAST: 75mL iohexol  (OMNIPAQUE ) 350 MG/ML injection COMPARISON: CT of the head dated 12/11/2023. FINDINGS: CT HEAD: BRAIN: No acute intraparenchymal hemorrhage. No mass lesion. No CT evidence for acute territorial infarct. No midline shift or extra-axial collection. VENTRICLES: No hydrocephalus. ORBITS: The orbits are unremarkable. SINUSES AND MASTOIDS: The paranasal sinuses and mastoid air cells are clear. CTA NECK: COMMON CAROTID ARTERIES: The right common carotid artery is tortuous, but normal in caliber. The left common carotid artery is tortuous, but normal in caliber. INTERNAL CAROTID ARTERIES: There is  mild calcific plaque present within the proximal right internal carotid artery, but no flow-limiting  stenosis. The internal carotid artery is tortuous and takes a retropharyngeal bend. There is moderate calcific plaque present within the proximal left internal carotid artery, with approximately 30% luminal stenosis. There is a hairpin turn within the distal cervical segment of the left internal carotid artery. VERTEBRAL ARTERIES: The vertebral arteries are codominant and normal in caliber throughout the neck. There is diminished opacification of the v3 segment of the left vertebral artery and there is severe calcific atheromatous disease and stenosis of the v4 segment of the left vertebral artery. There is calcific plaque within the right v4 segment with mild-to-moderate stenosis. CTA HEAD: ANTERIOR CEREBRAL ARTERIES: There is moderate-to-severe stenosis of the a2 branch of the right anterior cerebral artery. MIDDLE CEREBRAL ARTERIES: There is mild atheromatous irregularity of the M1 branches bilaterally. POSTERIOR CEREBRAL ARTERIES: There is moderate stenosis of the origin of the left p1 segment. There is also occlusive stenosis of the left p2 segment. BASILAR ARTERY: There is mild-to-moderate stenosis of the proximal basilar artery. OTHER: SOFT TISSUES: No acute finding. No masses or lymphadenopathy. BONES: No acute osseous abnormality. IMPRESSION: 1. No acute intracranial hemorrhage or ischemic change. 2. 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 3. Moderate-to-severe stenosis of the cavernous and ophthalmic segments of the left internal carotid artery and moderate stenosis of the anterior genu of the right cavernous segment. 4. Moderate-to-severe stenosis of the a2 branch of the right anterior cerebral artery. 5. Mild-to-moderate stenosis of the proximal basilar artery. Electronically signed by: evalene coho 12/11/2023 08:25 PM EDT RP Workstation: [id number]   CT Head Wo Contrast Result Date: 12/11/2023 CLINICAL DATA:  Neuro deficit, acute, stroke  suspected Weakness in legs. EXAM: CT HEAD WITHOUT CONTRAST TECHNIQUE: Contiguous axial images were obtained from the base of the skull through the vertex without intravenous contrast. RADIATION DOSE REDUCTION: This exam was performed according to the departmental dose-optimization program which includes automated exposure control, adjustment of the mA and/or kV according to patient size and/or use of iterative reconstruction technique. COMPARISON:  Remote head CT 08/21/2013 FINDINGS: Brain: No acute intracranial hemorrhage. There is no evidence of acute ischemia. Moderate area of encephalomalacia in the right temporal lobe. Small area of encephalomalacia in the left cerebellum. There are remote bilateral lacunar infarcts in the basal ganglia and caudate. Brain volume is normal for age. Mild to moderate periventricular and deep white matter hypodensity typical of chronic small vessel ischemia. No subdural or extra-axial collection. No evidence of mass lesion/mass effect or midline shift. 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 Skull: Normal. Negative for fracture or focal lesion. Sinuses/Orbits: Paranasal sinuses and mastoid air cells are clear. The visualized orbits are unremarkable. Other: None. IMPRESSION: 1. No acute intracranial abnormality. 2. Multifocal remote infarcts. 3. 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 Electronically Signed   By: Andrea Gasman M.D.   On: 12/11/2023 16:23    Microbiology: Results for orders placed or performed during the hospital encounter of 12/11/23  MRSA Next Gen by PCR, Nasal     Status: None   Collection Time: 12/11/23 10:03 PM   Specimen: Nasal Mucosa; Nasal Swab  Result Value Ref Range Status   MRSA by PCR Next Gen NOT DETECTED NOT DETECTED Final    Comment: (NOTE) The GeneXpert MRSA Assay (FDA approved for NASAL specimens only), is one component of a comprehensive MRSA colonization  surveillance  program. It is not intended to diagnose MRSA infection nor to guide or monitor treatment for MRSA infections. Test performance is not FDA approved in patients less than 32 years old. Performed at [facility], [address] [place], [place] [address]     Labs: CBC: Recent Labs  Lab 12/28/23 2013 12/28/23 2055 12/29/23 0538 12/29/23 1051 12/29/23 1711 12/29/23 2240 12/30/23 0514 12/30/23 1859 12/31/23 0454 01/01/24 0426  WBC 6.6  --  7.9  --   --   --  7.1  --   --   --   NEUTROABS 4.0  --   --   --   --   --   --   --   --   --   HGB 8.5*   < > 8.9* 9.0* 9.2* 7.2* 9.2* 10.1* 9.4* 9.9*  HCT 23.5*   < > 24.3* 25.4* 25.4* 20.2* 25.6*  --   --   --   MCV 92.2  --  89.3  --   --   --  90.5  --   --   --   PLT 227  --  292  --   --   --  218  --   --   --    < > = values in this interval not displayed.   Basic Metabolic Panel: Recent Labs  Lab 12/28/23 2013 12/29/23 0538 12/30/23 0514  NA 132* 134* 133*  K 3.8 3.5 3.7  CL 99 99 104  CO2 24 26 22   GLUCOSE 115* 99 89  BUN 40* 31* 19  CREATININE 0.64 0.62 0.66  CALCIUM  8.1* 8.2* 7.9*  MG 2.0  --   --    Liver Function Tests: Recent Labs  Lab 12/28/23 2013 12/30/23 0514  AST 48* 50*  ALT 55* 57*  ALKPHOS 36* 37*  BILITOT 0.8 0.6  PROT 5.5* 4.9*  ALBUMIN 2.7* 2.4*   CBG: No results for input(s): GLUCAP in the last 168 hours.  Discharge time spent:55 minutes.  Signed: Murvin Mana, MD [facility] 01/01/2024

## 2024-01-03 DIAGNOSIS — E7800 Pure hypercholesterolemia, unspecified: Secondary | ICD-10-CM | POA: Diagnosis not present

## 2024-01-03 DIAGNOSIS — Z681 Body mass index (BMI) 19 or less, adult: Secondary | ICD-10-CM | POA: Diagnosis not present

## 2024-01-03 DIAGNOSIS — E876 Hypokalemia: Secondary | ICD-10-CM | POA: Diagnosis not present

## 2024-01-03 DIAGNOSIS — K824 Cholesterolosis of gallbladder: Secondary | ICD-10-CM | POA: Diagnosis not present

## 2024-01-03 DIAGNOSIS — I69398 Other sequelae of cerebral infarction: Secondary | ICD-10-CM | POA: Diagnosis not present

## 2024-01-03 DIAGNOSIS — R531 Weakness: Secondary | ICD-10-CM | POA: Diagnosis not present

## 2024-01-03 DIAGNOSIS — I69322 Dysarthria following cerebral infarction: Secondary | ICD-10-CM | POA: Diagnosis not present

## 2024-01-03 DIAGNOSIS — E871 Hypo-osmolality and hyponatremia: Secondary | ICD-10-CM | POA: Diagnosis not present

## 2024-01-03 DIAGNOSIS — I2510 Atherosclerotic heart disease of native coronary artery without angina pectoris: Secondary | ICD-10-CM | POA: Diagnosis not present

## 2024-01-03 DIAGNOSIS — E46 Unspecified protein-calorie malnutrition: Secondary | ICD-10-CM | POA: Diagnosis not present

## 2024-01-03 DIAGNOSIS — Z7902 Long term (current) use of antithrombotics/antiplatelets: Secondary | ICD-10-CM | POA: Diagnosis not present

## 2024-01-03 DIAGNOSIS — I10 Essential (primary) hypertension: Secondary | ICD-10-CM | POA: Diagnosis not present

## 2024-01-03 DIAGNOSIS — Z7982 Long term (current) use of aspirin: Secondary | ICD-10-CM | POA: Diagnosis not present

## 2024-01-03 NOTE — Anesthesia Postprocedure Evaluation (Signed)
 Anesthesia Post Note  Patient: Scott Mermelstein Jr.  Procedure(s) Performed: EGD (ESOPHAGOGASTRODUODENOSCOPY) COLONOSCOPY POLYPECTOMY, INTESTINE  Patient location during evaluation: Endoscopy Anesthesia Type: General Level of consciousness: awake and alert Pain management: pain level controlled Vital Signs Assessment: post-procedure vital signs reviewed and stable Respiratory status: spontaneous breathing, nonlabored ventilation and respiratory function stable Cardiovascular status: blood pressure returned to baseline and stable Postop Assessment: no apparent nausea or vomiting Anesthetic complications: no   No notable events documented.   Last Vitals:  Vitals:   01/01/24 1525 01/01/24 1536  BP:  125/78  Pulse: (!) 52 (!) 58  Resp:  20  Temp:  36.4 C  SpO2: 100% 100%    Last Pain:  Vitals:   01/01/24 1536  TempSrc: Oral  PainSc: 0-No pain                 Fairy POUR Ailyn Gladd

## 2024-01-06 DIAGNOSIS — I2510 Atherosclerotic heart disease of native coronary artery without angina pectoris: Secondary | ICD-10-CM | POA: Diagnosis not present

## 2024-01-06 DIAGNOSIS — E871 Hypo-osmolality and hyponatremia: Secondary | ICD-10-CM | POA: Diagnosis not present

## 2024-01-06 DIAGNOSIS — I69398 Other sequelae of cerebral infarction: Secondary | ICD-10-CM | POA: Diagnosis not present

## 2024-01-06 DIAGNOSIS — E7800 Pure hypercholesterolemia, unspecified: Secondary | ICD-10-CM | POA: Diagnosis not present

## 2024-01-06 DIAGNOSIS — K824 Cholesterolosis of gallbladder: Secondary | ICD-10-CM | POA: Diagnosis not present

## 2024-01-06 DIAGNOSIS — I10 Essential (primary) hypertension: Secondary | ICD-10-CM | POA: Diagnosis not present

## 2024-01-06 DIAGNOSIS — Z681 Body mass index (BMI) 19 or less, adult: Secondary | ICD-10-CM | POA: Diagnosis not present

## 2024-01-06 DIAGNOSIS — Z7902 Long term (current) use of antithrombotics/antiplatelets: Secondary | ICD-10-CM | POA: Diagnosis not present

## 2024-01-06 DIAGNOSIS — I69322 Dysarthria following cerebral infarction: Secondary | ICD-10-CM | POA: Diagnosis not present

## 2024-01-06 DIAGNOSIS — E46 Unspecified protein-calorie malnutrition: Secondary | ICD-10-CM | POA: Diagnosis not present

## 2024-01-06 DIAGNOSIS — R531 Weakness: Secondary | ICD-10-CM | POA: Diagnosis not present

## 2024-01-06 DIAGNOSIS — E876 Hypokalemia: Secondary | ICD-10-CM | POA: Diagnosis not present

## 2024-01-06 DIAGNOSIS — Z7982 Long term (current) use of aspirin: Secondary | ICD-10-CM | POA: Diagnosis not present

## 2024-01-06 LAB — SURGICAL PATHOLOGY

## 2024-01-08 DIAGNOSIS — E46 Unspecified protein-calorie malnutrition: Secondary | ICD-10-CM | POA: Diagnosis not present

## 2024-01-08 DIAGNOSIS — I69322 Dysarthria following cerebral infarction: Secondary | ICD-10-CM | POA: Diagnosis not present

## 2024-01-08 DIAGNOSIS — I10 Essential (primary) hypertension: Secondary | ICD-10-CM | POA: Diagnosis not present

## 2024-01-08 DIAGNOSIS — K824 Cholesterolosis of gallbladder: Secondary | ICD-10-CM | POA: Diagnosis not present

## 2024-01-08 DIAGNOSIS — E7800 Pure hypercholesterolemia, unspecified: Secondary | ICD-10-CM | POA: Diagnosis not present

## 2024-01-08 DIAGNOSIS — E871 Hypo-osmolality and hyponatremia: Secondary | ICD-10-CM | POA: Diagnosis not present

## 2024-01-08 DIAGNOSIS — E876 Hypokalemia: Secondary | ICD-10-CM | POA: Diagnosis not present

## 2024-01-08 DIAGNOSIS — R531 Weakness: Secondary | ICD-10-CM | POA: Diagnosis not present

## 2024-01-08 DIAGNOSIS — Z7982 Long term (current) use of aspirin: Secondary | ICD-10-CM | POA: Diagnosis not present

## 2024-01-08 DIAGNOSIS — Z681 Body mass index (BMI) 19 or less, adult: Secondary | ICD-10-CM | POA: Diagnosis not present

## 2024-01-08 DIAGNOSIS — I2510 Atherosclerotic heart disease of native coronary artery without angina pectoris: Secondary | ICD-10-CM | POA: Diagnosis not present

## 2024-01-08 DIAGNOSIS — Z7902 Long term (current) use of antithrombotics/antiplatelets: Secondary | ICD-10-CM | POA: Diagnosis not present

## 2024-01-08 DIAGNOSIS — I69398 Other sequelae of cerebral infarction: Secondary | ICD-10-CM | POA: Diagnosis not present

## 2024-01-13 ENCOUNTER — Telehealth: Payer: Self-pay | Admitting: Osteopathic Medicine

## 2024-01-13 DIAGNOSIS — I10 Essential (primary) hypertension: Secondary | ICD-10-CM | POA: Diagnosis not present

## 2024-01-13 DIAGNOSIS — I69398 Other sequelae of cerebral infarction: Secondary | ICD-10-CM | POA: Diagnosis not present

## 2024-01-13 DIAGNOSIS — E7800 Pure hypercholesterolemia, unspecified: Secondary | ICD-10-CM | POA: Diagnosis not present

## 2024-01-13 DIAGNOSIS — I69322 Dysarthria following cerebral infarction: Secondary | ICD-10-CM | POA: Diagnosis not present

## 2024-01-13 DIAGNOSIS — I2510 Atherosclerotic heart disease of native coronary artery without angina pectoris: Secondary | ICD-10-CM | POA: Diagnosis not present

## 2024-01-13 DIAGNOSIS — K824 Cholesterolosis of gallbladder: Secondary | ICD-10-CM | POA: Diagnosis not present

## 2024-01-13 DIAGNOSIS — E871 Hypo-osmolality and hyponatremia: Secondary | ICD-10-CM | POA: Diagnosis not present

## 2024-01-13 DIAGNOSIS — E46 Unspecified protein-calorie malnutrition: Secondary | ICD-10-CM | POA: Diagnosis not present

## 2024-01-13 DIAGNOSIS — Z7982 Long term (current) use of aspirin: Secondary | ICD-10-CM | POA: Diagnosis not present

## 2024-01-13 DIAGNOSIS — R531 Weakness: Secondary | ICD-10-CM | POA: Diagnosis not present

## 2024-01-13 DIAGNOSIS — Z7902 Long term (current) use of antithrombotics/antiplatelets: Secondary | ICD-10-CM | POA: Diagnosis not present

## 2024-01-13 DIAGNOSIS — Z681 Body mass index (BMI) 19 or less, adult: Secondary | ICD-10-CM | POA: Diagnosis not present

## 2024-01-13 DIAGNOSIS — E876 Hypokalemia: Secondary | ICD-10-CM | POA: Diagnosis not present

## 2024-01-13 NOTE — Telephone Encounter (Signed)
 Copied from CRM #[id number]. Topic: Clinical - Medication Refill >> Jan 13, 2024 10:58 AM Zy'onna H wrote: Medication:  atorvastatin  (LIPITOR) 40 MG tablet Lisinopril  20MG  --= Not on  Rx List (100 tablets) requested per Rx   ** Joy -  from [employer] called in to request the following Rx be filled for patient**  Callback: [phone] Has the patient contacted their pharmacy? Yes (Agent: If no, request that the patient contact the pharmacy for the refill. If patient does not wish to contact the pharmacy document the reason why and proceed with request.) (Agent: If yes, when and what did the pharmacy advise?)  This is the patient's preferred pharmacy:  [facility] [address] ([address]), [place] - [address] [place]) [place] [address] Phone: [phone] Fax: [phone]  Is this the correct pharmacy for this prescription? Yes If no, delete pharmacy and type the correct one.   Has the prescription been filled recently? No  Is the patient out of the medication? Yes  Has the patient been seen for an appointment in the last year OR does the patient have an upcoming appointment? Yes  Can we respond through MyChart? No  Agent: Please be advised that Rx refills may take up to 3 business days. We ask that you follow-up with your pharmacy.

## 2024-01-13 NOTE — Telephone Encounter (Signed)
 Pt may request from PCP office, [facility].

## 2024-01-15 DIAGNOSIS — Z7982 Long term (current) use of aspirin: Secondary | ICD-10-CM | POA: Diagnosis not present

## 2024-01-15 DIAGNOSIS — E46 Unspecified protein-calorie malnutrition: Secondary | ICD-10-CM | POA: Diagnosis not present

## 2024-01-15 DIAGNOSIS — I69322 Dysarthria following cerebral infarction: Secondary | ICD-10-CM | POA: Diagnosis not present

## 2024-01-15 DIAGNOSIS — E7800 Pure hypercholesterolemia, unspecified: Secondary | ICD-10-CM | POA: Diagnosis not present

## 2024-01-15 DIAGNOSIS — I69398 Other sequelae of cerebral infarction: Secondary | ICD-10-CM | POA: Diagnosis not present

## 2024-01-15 DIAGNOSIS — Z7902 Long term (current) use of antithrombotics/antiplatelets: Secondary | ICD-10-CM | POA: Diagnosis not present

## 2024-01-15 DIAGNOSIS — I2510 Atherosclerotic heart disease of native coronary artery without angina pectoris: Secondary | ICD-10-CM | POA: Diagnosis not present

## 2024-01-15 DIAGNOSIS — Z681 Body mass index (BMI) 19 or less, adult: Secondary | ICD-10-CM | POA: Diagnosis not present

## 2024-01-15 DIAGNOSIS — I10 Essential (primary) hypertension: Secondary | ICD-10-CM | POA: Diagnosis not present

## 2024-01-15 DIAGNOSIS — E876 Hypokalemia: Secondary | ICD-10-CM | POA: Diagnosis not present

## 2024-01-15 DIAGNOSIS — K824 Cholesterolosis of gallbladder: Secondary | ICD-10-CM | POA: Diagnosis not present

## 2024-01-15 DIAGNOSIS — R531 Weakness: Secondary | ICD-10-CM | POA: Diagnosis not present

## 2024-01-15 DIAGNOSIS — E871 Hypo-osmolality and hyponatremia: Secondary | ICD-10-CM | POA: Diagnosis not present

## 2024-01-21 DIAGNOSIS — I69322 Dysarthria following cerebral infarction: Secondary | ICD-10-CM | POA: Diagnosis not present

## 2024-01-21 DIAGNOSIS — E7800 Pure hypercholesterolemia, unspecified: Secondary | ICD-10-CM | POA: Diagnosis not present

## 2024-01-21 DIAGNOSIS — I2510 Atherosclerotic heart disease of native coronary artery without angina pectoris: Secondary | ICD-10-CM | POA: Diagnosis not present

## 2024-01-21 DIAGNOSIS — E871 Hypo-osmolality and hyponatremia: Secondary | ICD-10-CM | POA: Diagnosis not present

## 2024-01-21 DIAGNOSIS — I10 Essential (primary) hypertension: Secondary | ICD-10-CM | POA: Diagnosis not present

## 2024-01-21 DIAGNOSIS — Z7902 Long term (current) use of antithrombotics/antiplatelets: Secondary | ICD-10-CM | POA: Diagnosis not present

## 2024-01-21 DIAGNOSIS — K824 Cholesterolosis of gallbladder: Secondary | ICD-10-CM | POA: Diagnosis not present

## 2024-01-21 DIAGNOSIS — Z7982 Long term (current) use of aspirin: Secondary | ICD-10-CM | POA: Diagnosis not present

## 2024-01-21 DIAGNOSIS — R531 Weakness: Secondary | ICD-10-CM | POA: Diagnosis not present

## 2024-01-21 DIAGNOSIS — E876 Hypokalemia: Secondary | ICD-10-CM | POA: Diagnosis not present

## 2024-01-21 DIAGNOSIS — Z681 Body mass index (BMI) 19 or less, adult: Secondary | ICD-10-CM | POA: Diagnosis not present

## 2024-01-21 DIAGNOSIS — E46 Unspecified protein-calorie malnutrition: Secondary | ICD-10-CM | POA: Diagnosis not present

## 2024-01-21 DIAGNOSIS — I69398 Other sequelae of cerebral infarction: Secondary | ICD-10-CM | POA: Diagnosis not present

## 2024-01-22 DIAGNOSIS — E7800 Pure hypercholesterolemia, unspecified: Secondary | ICD-10-CM | POA: Diagnosis not present

## 2024-01-22 DIAGNOSIS — E871 Hypo-osmolality and hyponatremia: Secondary | ICD-10-CM | POA: Diagnosis not present

## 2024-01-22 DIAGNOSIS — I2510 Atherosclerotic heart disease of native coronary artery without angina pectoris: Secondary | ICD-10-CM | POA: Diagnosis not present

## 2024-01-22 DIAGNOSIS — Z7982 Long term (current) use of aspirin: Secondary | ICD-10-CM | POA: Diagnosis not present

## 2024-01-22 DIAGNOSIS — E46 Unspecified protein-calorie malnutrition: Secondary | ICD-10-CM | POA: Diagnosis not present

## 2024-01-22 DIAGNOSIS — I69322 Dysarthria following cerebral infarction: Secondary | ICD-10-CM | POA: Diagnosis not present

## 2024-01-22 DIAGNOSIS — R531 Weakness: Secondary | ICD-10-CM | POA: Diagnosis not present

## 2024-01-22 DIAGNOSIS — Z008 Encounter for other general examination: Secondary | ICD-10-CM | POA: Diagnosis not present

## 2024-01-22 DIAGNOSIS — E876 Hypokalemia: Secondary | ICD-10-CM | POA: Diagnosis not present

## 2024-01-22 DIAGNOSIS — Z681 Body mass index (BMI) 19 or less, adult: Secondary | ICD-10-CM | POA: Diagnosis not present

## 2024-01-22 DIAGNOSIS — I69398 Other sequelae of cerebral infarction: Secondary | ICD-10-CM | POA: Diagnosis not present

## 2024-01-22 DIAGNOSIS — K824 Cholesterolosis of gallbladder: Secondary | ICD-10-CM | POA: Diagnosis not present

## 2024-01-22 DIAGNOSIS — Z7902 Long term (current) use of antithrombotics/antiplatelets: Secondary | ICD-10-CM | POA: Diagnosis not present

## 2024-01-22 DIAGNOSIS — I10 Essential (primary) hypertension: Secondary | ICD-10-CM | POA: Diagnosis not present

## 2024-01-22 DIAGNOSIS — E785 Hyperlipidemia, unspecified: Secondary | ICD-10-CM | POA: Diagnosis not present

## 2024-01-28 DIAGNOSIS — I10 Essential (primary) hypertension: Secondary | ICD-10-CM | POA: Diagnosis not present

## 2024-01-28 DIAGNOSIS — Z7902 Long term (current) use of antithrombotics/antiplatelets: Secondary | ICD-10-CM | POA: Diagnosis not present

## 2024-01-28 DIAGNOSIS — Z681 Body mass index (BMI) 19 or less, adult: Secondary | ICD-10-CM | POA: Diagnosis not present

## 2024-01-28 DIAGNOSIS — I2510 Atherosclerotic heart disease of native coronary artery without angina pectoris: Secondary | ICD-10-CM | POA: Diagnosis not present

## 2024-01-28 DIAGNOSIS — I69398 Other sequelae of cerebral infarction: Secondary | ICD-10-CM | POA: Diagnosis not present

## 2024-01-28 DIAGNOSIS — E46 Unspecified protein-calorie malnutrition: Secondary | ICD-10-CM | POA: Diagnosis not present

## 2024-01-28 DIAGNOSIS — E7800 Pure hypercholesterolemia, unspecified: Secondary | ICD-10-CM | POA: Diagnosis not present

## 2024-01-28 DIAGNOSIS — E876 Hypokalemia: Secondary | ICD-10-CM | POA: Diagnosis not present

## 2024-01-28 DIAGNOSIS — K824 Cholesterolosis of gallbladder: Secondary | ICD-10-CM | POA: Diagnosis not present

## 2024-01-28 DIAGNOSIS — I69322 Dysarthria following cerebral infarction: Secondary | ICD-10-CM | POA: Diagnosis not present

## 2024-01-28 DIAGNOSIS — R531 Weakness: Secondary | ICD-10-CM | POA: Diagnosis not present

## 2024-01-28 DIAGNOSIS — E871 Hypo-osmolality and hyponatremia: Secondary | ICD-10-CM | POA: Diagnosis not present

## 2024-01-28 DIAGNOSIS — Z7982 Long term (current) use of aspirin: Secondary | ICD-10-CM | POA: Diagnosis not present

## 2024-01-31 DIAGNOSIS — F1010 Alcohol abuse, uncomplicated: Secondary | ICD-10-CM | POA: Diagnosis not present

## 2024-01-31 DIAGNOSIS — E785 Hyperlipidemia, unspecified: Secondary | ICD-10-CM | POA: Diagnosis not present

## 2024-01-31 DIAGNOSIS — B182 Chronic viral hepatitis C: Secondary | ICD-10-CM | POA: Diagnosis not present

## 2024-01-31 DIAGNOSIS — E119 Type 2 diabetes mellitus without complications: Secondary | ICD-10-CM | POA: Diagnosis not present

## 2024-01-31 DIAGNOSIS — Z1331 Encounter for screening for depression: Secondary | ICD-10-CM | POA: Diagnosis not present

## 2024-01-31 DIAGNOSIS — R7989 Other specified abnormal findings of blood chemistry: Secondary | ICD-10-CM | POA: Diagnosis not present

## 2024-01-31 DIAGNOSIS — Z09 Encounter for follow-up examination after completed treatment for conditions other than malignant neoplasm: Secondary | ICD-10-CM | POA: Diagnosis not present

## 2024-01-31 DIAGNOSIS — K264 Chronic or unspecified duodenal ulcer with hemorrhage: Secondary | ICD-10-CM | POA: Diagnosis not present

## 2024-01-31 DIAGNOSIS — I5189 Other ill-defined heart diseases: Secondary | ICD-10-CM | POA: Diagnosis not present

## 2024-01-31 DIAGNOSIS — I639 Cerebral infarction, unspecified: Secondary | ICD-10-CM | POA: Diagnosis not present

## 2024-01-31 DIAGNOSIS — Z0000 Encounter for general adult medical examination without abnormal findings: Secondary | ICD-10-CM | POA: Diagnosis not present

## 2024-01-31 DIAGNOSIS — I2510 Atherosclerotic heart disease of native coronary artery without angina pectoris: Secondary | ICD-10-CM | POA: Diagnosis not present

## 2024-01-31 DIAGNOSIS — I10 Essential (primary) hypertension: Secondary | ICD-10-CM | POA: Diagnosis not present

## 2024-02-07 DIAGNOSIS — E46 Unspecified protein-calorie malnutrition: Secondary | ICD-10-CM | POA: Diagnosis not present

## 2024-02-07 DIAGNOSIS — E876 Hypokalemia: Secondary | ICD-10-CM | POA: Diagnosis not present

## 2024-02-07 DIAGNOSIS — E7800 Pure hypercholesterolemia, unspecified: Secondary | ICD-10-CM | POA: Diagnosis not present

## 2024-02-07 DIAGNOSIS — E871 Hypo-osmolality and hyponatremia: Secondary | ICD-10-CM | POA: Diagnosis not present

## 2024-02-07 DIAGNOSIS — Z7982 Long term (current) use of aspirin: Secondary | ICD-10-CM | POA: Diagnosis not present

## 2024-02-07 DIAGNOSIS — I2510 Atherosclerotic heart disease of native coronary artery without angina pectoris: Secondary | ICD-10-CM | POA: Diagnosis not present

## 2024-02-07 DIAGNOSIS — R531 Weakness: Secondary | ICD-10-CM | POA: Diagnosis not present

## 2024-02-07 DIAGNOSIS — I10 Essential (primary) hypertension: Secondary | ICD-10-CM | POA: Diagnosis not present

## 2024-02-07 DIAGNOSIS — K824 Cholesterolosis of gallbladder: Secondary | ICD-10-CM | POA: Diagnosis not present

## 2024-02-07 DIAGNOSIS — I69322 Dysarthria following cerebral infarction: Secondary | ICD-10-CM | POA: Diagnosis not present

## 2024-02-07 DIAGNOSIS — Z7902 Long term (current) use of antithrombotics/antiplatelets: Secondary | ICD-10-CM | POA: Diagnosis not present

## 2024-02-07 DIAGNOSIS — I69398 Other sequelae of cerebral infarction: Secondary | ICD-10-CM | POA: Diagnosis not present

## 2024-02-07 DIAGNOSIS — Z681 Body mass index (BMI) 19 or less, adult: Secondary | ICD-10-CM | POA: Diagnosis not present

## 2024-02-11 DIAGNOSIS — Z008 Encounter for other general examination: Secondary | ICD-10-CM | POA: Diagnosis not present

## 2024-02-11 DIAGNOSIS — H409 Unspecified glaucoma: Secondary | ICD-10-CM | POA: Diagnosis not present

## 2024-02-11 DIAGNOSIS — I69352 Hemiplegia and hemiparesis following cerebral infarction affecting left dominant side: Secondary | ICD-10-CM | POA: Diagnosis not present

## 2024-02-11 DIAGNOSIS — R269 Unspecified abnormalities of gait and mobility: Secondary | ICD-10-CM | POA: Diagnosis not present

## 2024-02-11 DIAGNOSIS — Z681 Body mass index (BMI) 19 or less, adult: Secondary | ICD-10-CM | POA: Diagnosis not present

## 2024-04-09 ENCOUNTER — Ambulatory Visit: Payer: Self-pay | Admitting: Neurology

## 2024-04-09 ENCOUNTER — Encounter: Payer: Self-pay | Admitting: Vascular Neurology
